# Patient Record
Sex: Male | Born: 1975
Health system: Southern US, Community
[De-identification: ages and names within clinical notes are randomized; demographics above are authoritative.]

## PROBLEM LIST (undated history)

## (undated) DIAGNOSIS — I10 Essential (primary) hypertension: Secondary | ICD-10-CM

## (undated) DIAGNOSIS — Z9989 Dependence on other enabling machines and devices: Secondary | ICD-10-CM

## (undated) DIAGNOSIS — G4733 Obstructive sleep apnea (adult) (pediatric): Secondary | ICD-10-CM

## (undated) HISTORY — DX: Essential (primary) hypertension: I10

## (undated) HISTORY — DX: Obstructive sleep apnea (adult) (pediatric): G47.33

## (undated) HISTORY — DX: Obstructive sleep apnea (adult) (pediatric): Z99.89

---

## 1998-05-30 ENCOUNTER — Emergency Department (HOSPITAL_COMMUNITY): Admission: EM | Admit: 1998-05-30 | Discharge: 1998-05-30 | Payer: Self-pay | Admitting: Emergency Medicine

## 2011-05-14 ENCOUNTER — Inpatient Hospital Stay (HOSPITAL_COMMUNITY)
Admission: AD | Admit: 2011-05-14 | Discharge: 2011-05-17 | DRG: 465 | Disposition: A | Discharge status: Home / Self Care | Payer: Managed Care, Other (non HMO) | Source: Ambulatory Visit | Attending: Orthopedic Surgery | Admitting: Orthopedic Surgery

## 2011-05-14 DIAGNOSIS — M869 Osteomyelitis, unspecified: Secondary | ICD-10-CM

## 2011-05-14 DIAGNOSIS — F17200 Nicotine dependence, unspecified, uncomplicated: Secondary | ICD-10-CM | POA: Diagnosis present

## 2011-05-14 DIAGNOSIS — L03039 Cellulitis of unspecified toe: Secondary | ICD-10-CM | POA: Diagnosis present

## 2011-05-14 DIAGNOSIS — L02619 Cutaneous abscess of unspecified foot: Secondary | ICD-10-CM | POA: Diagnosis present

## 2011-05-14 DIAGNOSIS — I10 Essential (primary) hypertension: Secondary | ICD-10-CM | POA: Diagnosis present

## 2011-05-14 LAB — HIV ANTIBODY (ROUTINE TESTING W REFLEX): HIV: NONREACTIVE

## 2011-05-14 LAB — MRSA PCR SCREENING: MRSA by PCR: NEGATIVE

## 2011-05-15 LAB — BASIC METABOLIC PANEL
BUN: 7 mg/dL (ref 6–23)
Creatinine, Ser: 0.71 mg/dL (ref 0.50–1.35)
GFR calc Af Amer: >60 mL/min (ref >60)
GFR calc non Af Amer: >60 mL/min (ref >60)

## 2011-05-16 NOTE — Op Note (Addendum)
NAMEDALEN, HENNESSEE NO.:  [id number]  MEDICAL RECORD NO.:  [id number]  LOCATION:  5025                         FACILITY:  [facility]  PHYSICIAN:  Feliberto Gottron. Turner Daniels, M.D.   DATE OF BIRTH:  1976-09-12  DATE OF PROCEDURE:  05/14/2011 DATE OF DISCHARGE:                              OPERATIVE REPORT   PREOPERATIVE DIAGNOSIS:  Necrotizing infection of the left fifth toe.  POSTOPERATIVE DIAGNOSIS:  Necrotizing infection of the left hip.  POSTOPERATIVE DIAGNOSIS:  Pain with an abscess.  PROCEDURE:  Irrigation and debridement left fifth toe.  SURGEON:  Feliberto Gottron. Turner Daniels, MD  FIRST ASSISTANT:  None.  ANESTHETIC:  General LMA.  ESTIMATED BLOOD LOSS:  10 mL.  BLOOD REPLACED:  500 mL of crystalloid.  DRAINS PLACED:  None.  TOURNIQUET TIME:  None.  INDICATIONS FOR PROCEDURE:  A 35 year old man who sustained an abrasion to his left fifth toe distal to the nail about 4 days ago.  He then began to develop swelling and pain in the toe, developed a blister yesterday, the blister ruptured, he was seen by the primary care physician, given a prescription for doxycycline and got Orthopedics evaluation this morning.  He had a necrotized area to the dorsal aspect of the left fifth toe about a centimeter in size that was full-thickness and down to the tendon.  There was obvious green purulent material in the wound and he had edema extending up the foot just below the ankle. He was afebrile, but having significant pain.  Because of the rather aggressive nature of the infection and necrosis of the dorsal end of the small toe, he was admitted for IV antibiotics for urgent irrigation and debridement.  Risks and benefits of surgery discussed, questions answered.  DESCRIPTION OF PROCEDURE:  The patient was identified by armband and taken to the operating room 4 at [facility].  Appropriate anesthetic monitors were attached and general LMA anesthesia was induced.  Tourniquet  was applied to the left ankle.  Left foot prepped and draped in usual sterile fashion from the toes to the tourniquet. Time-out procedure performed.  We began the operation by taking some soft tissue samples from the ulcer on the dorsum of the small toe and sending it off for Gram-stain and culture.  We then used a 15-blade and rongeurs to debride necrotic tissue from around the ulceration, which was about a centimeter in size down to the extensor tendon.  Once we cut back to a good bleeding tissue, we went ahead, thoroughly irrigated with normal saline solution alternating with hydrogen peroxide.  At this point, the ulceration was relatively clean.  We thoroughly probed proximally and distally looking for any pus pocket then removed and femoral lysis from the medial and lateral edges of patella as well.  We then placed a small Iodoform wick in the ulcer and a dressing of 4x4s between the toes, 4x4s on top of the ulcer with a 2-inch Kerlix and a 2- inch Ace wrap.  At this point, the patient was awakened, extubated, taken to the recovery room.     Feliberto Gottron. Turner Daniels, M.D.     Frithjof.Angelica  D:  05/14/2011  T:  05/15/2011  Job:  [id number]  Electronically Signed by Gean Birchwood M.D. on 06/16/2011 12:40:16 PM

## 2011-05-17 LAB — TISSUE CULTURE

## 2011-05-19 LAB — ANAEROBIC CULTURE

## 2011-06-13 NOTE — Discharge Summary (Signed)
  NAMEACEY, WOODFIELD NO.:  [id number]  MEDICAL RECORD NO.:  [id number]  LOCATION:  5025                         FACILITY:  [facility]  PHYSICIAN:  Feliberto Gottron. Turner Daniels, M.D.   DATE OF BIRTH:  08-15-1976  DATE OF ADMISSION:  05/14/2011 DATE OF DISCHARGE:  05/17/2011                              DISCHARGE SUMMARY   CHIEF COMPLAINT:  Left toe infection.  HISTORY OF PRESENT ILLNESS:  This is a 35 year old gentleman who stubbed his toe on May 04, 2011, at home.  He developed a sore that worsened over a period of several days.  [facility], [facility], where his toe was lanced.  He was given a prescription for antibiotics.  He presented to Dr. Wadie Lessen clinic on May 14, 2011, for orthopedic followup.  After evaluating the toe, it was determined that he would need incision and drainage, so he was admitted to [facility] for surgery.  PAST MEDICAL HISTORY:  Newly diagnosed hypertension. PAST SURGICAL HISTORY:  He has never had surgery.  ALLERGIES:  No known drug allergies.  SOCIAL HISTORY:  He smokes 1-pack of cigarettes per day and denies the use of alcohol.  FAMILY HISTORY:  Noncontributory.  OBJECTIVE:  Gross examination of the left foot demonstrates swelling and erythema at the fifth toe with purulent drainage.  IMAGING:  X-rays demonstrate no evidence of fracture.  PREOPERATIVE LABORATORY DATA:  Sodium 140, potassium 3.7, chloride 104, glucose 97, BUN 7, and creatinine 0.71.  HOSPITAL COURSE:  Mr. Fitchett was admitted from the clinic to [facility] on May 14, 2011, for incision and drainage of the fifth toe infection. Infectious Disease was consulted for managing his antibiotics.  The procedure was performed by Dr. Turner Daniels on May 14, 2011.  He did an incision and drainage of the left fifth toe for osteomyelitis.  A drain was placed into the foot and Mr. Norrington was transferred back to the Orthopedic Floor.  On postop day #1, he was awake  and alert and reporting good pain control.  The weight was removed from the left toe. His wound cultures were pending.  On postoperative day #2, he was doing well and able to void.  By the third postoperative day, his cultures had come back positive for methicillin-sensitive staph aureus.  He was discharged home on oral antibiotics.  DISPOSITION:  The patient was discharged home on May 17, 2011.  He would return to the clinic in 1 week for x-rays and suture removal.  DISCHARGE MEDICATIONS:  As per the HMR with the addition of Augmentin and Percocet for pain.  FINAL DIAGNOSIS:  Left fifth toe osteomyelitis.     Shirl Harris, PA   ______________________________ Feliberto Gottron. Turner Daniels, M.D.    JW/MEDQ  D:  06/08/2011  T:  06/08/2011  Job:  [id number]  Electronically Signed by Shirl Harris PA on 06/10/2011 08:23:00 AM Electronically Signed by Gean Birchwood M.D. on 06/13/2011 11:53:27 PM

## 2011-06-24 ENCOUNTER — Ambulatory Visit: Payer: Managed Care, Other (non HMO) | Admitting: Infectious Disease

## 2011-07-22 ENCOUNTER — Telehealth: Payer: Self-pay | Admitting: *Deleted

## 2011-07-22 ENCOUNTER — Ambulatory Visit: Payer: Managed Care, Other (non HMO) | Admitting: Infectious Disease

## 2011-07-22 NOTE — Telephone Encounter (Signed)
He did not come to his f/u appt. I called him & rescheduled it

## 2011-08-04 ENCOUNTER — Ambulatory Visit: Payer: Managed Care, Other (non HMO) | Admitting: Infectious Disease

## 2013-05-17 ENCOUNTER — Encounter (HOSPITAL_COMMUNITY): Payer: Self-pay | Admitting: Emergency Medicine

## 2013-05-17 ENCOUNTER — Emergency Department (HOSPITAL_COMMUNITY)
Admission: EM | Admit: 2013-05-17 | Discharge: 2013-05-17 | Disposition: A | Discharge status: Home / Self Care | Payer: BC Managed Care – PPO | Attending: Emergency Medicine | Admitting: Emergency Medicine

## 2013-05-17 DIAGNOSIS — M5431 Sciatica, right side: Secondary | ICD-10-CM

## 2013-05-17 DIAGNOSIS — F17200 Nicotine dependence, unspecified, uncomplicated: Secondary | ICD-10-CM | POA: Insufficient documentation

## 2013-05-17 DIAGNOSIS — Z79899 Other long term (current) drug therapy: Secondary | ICD-10-CM | POA: Insufficient documentation

## 2013-05-17 DIAGNOSIS — M79609 Pain in unspecified limb: Secondary | ICD-10-CM | POA: Insufficient documentation

## 2013-05-17 DIAGNOSIS — I10 Essential (primary) hypertension: Secondary | ICD-10-CM | POA: Insufficient documentation

## 2013-05-17 DIAGNOSIS — M5430 Sciatica, unspecified side: Secondary | ICD-10-CM | POA: Insufficient documentation

## 2013-05-17 MED ORDER — OXYCODONE-ACETAMINOPHEN 5-325 MG PO TABS: 1.0000 | ORAL_TABLET | ORAL | Status: DC | PRN

## 2013-05-17 MED ORDER — MELOXICAM 15 MG PO TABS: 15.0000 mg | ORAL_TABLET | Freq: Every day | ORAL | Status: DC

## 2013-05-17 MED ORDER — DEXAMETHASONE SODIUM PHOSPHATE 10 MG/ML IJ SOLN
10.0000 mg | Freq: Once | INTRAMUSCULAR | Status: AC
  Administered 2013-05-17: 10 mg via INTRAMUSCULAR
  Filled 2013-05-17: qty 1

## 2013-05-17 NOTE — ED Notes (Signed)
On Sunday began having rt leg pain that radiates to rt leg and then went to urgent care on mon and was given pain medication and a shot. States that he pain is still there and that it is not gettingany better. Denies any injury.

## 2013-05-17 NOTE — ED Provider Notes (Signed)
History    This chart was scribed for non-physician practitioner, Arthor Captain, PA-C,  working with Vida Roller, MD by Donne Anon, ED Scribe. This patient was seen in room  and the patient's care was started at 1520.   CSN: [id number]  Arrival date & time 05/17/13  1434   None     Chief Complaint  Patient presents with  . Back Pain  . Leg Pain    The history is provided by the patient. No language interpreter was used.   HPI Comments: Kevin Roy is a 37 y.o. male who presents to the Emergency Department complaining of 4 days of gradual onset, gradually worsening, constant, severe right sided back pain that radiates down the back side of his right leg described as "pounding." He denies any recent injury. He was seen in at [facility] 3 days ago and was given a shot of a muscle relaxer that has provided little relief. Walking and bending over makes the pain worse and he reports laying flat makes the pain slightly better. He denies incontinence, loss of sensation in the groin, knee pain, foot pain, fever, neck stiffness, chills, dysuria, increased frequency, hematauria or any other pain. He denies a history of IV drug use, herniated discs, kidney stones or recent procedures.  He has a hx of HTN and does not currently have a PCP. He is a current everyday smoker.     History reviewed. No pertinent past medical history.  History reviewed. No pertinent past surgical history.  No family history on file.  History  Substance Use Topics  . Smoking status: Not on file  . Smokeless tobacco: Not on file  . Alcohol Use: Not on file      Review of Systems  Constitutional: Negative for fever and chills.  HENT: Negative for neck pain.   Genitourinary: Negative for dysuria, frequency and hematuria.  Musculoskeletal: Positive for back pain.  Neurological: Negative for numbness.  All other systems reviewed and are negative.    Allergies  Review of patient's allergies  indicates no known allergies.  Home Medications   Current Outpatient Rx  Name  Route  Sig  Dispense  Refill  . amLODipine (NORVASC) 10 MG tablet   Oral   Take 10 mg by mouth daily.         . cyclobenzaprine (FLEXERIL) 10 MG tablet   Oral   Take 10 mg by mouth 3 (three) times daily as needed for muscle spasms.         Marland Kitchen HYDROcodone-acetaminophen (VICODIN) 5-500 MG per tablet   Oral   Take 1 tablet by mouth every 6 (six) hours as needed for pain.           Triage Vitals; BP 149/97  Pulse 86  Temp(Src) 98.1 F (36.7 C) (Oral)  Resp 18  Ht 6' 2.5" (1.892 m)  Wt 299 lb (135.626 kg)  BMI 37.89 kg/m2  SpO2 95%  Physical Exam  Nursing note and vitals reviewed. Constitutional: He appears well-developed and well-nourished. No distress.  HENT:  Head: Normocephalic and atraumatic.  Eyes: Conjunctivae are normal.  Neck: Normal range of motion.  Cardiovascular: Normal rate, regular rhythm and normal heart sounds.   No murmur heard. Pulmonary/Chest: Effort normal and breath sounds normal.  Abdominal: Soft.  Musculoskeletal: Normal range of motion.  Positive straight leg raise on right side. Normal reflexes. Strength limited due to pain.   Neurological: He is alert.  Skin: Skin is warm and dry.  Psychiatric: He has a normal mood and affect. His behavior is normal.    ED Course  Procedures (including critical care time) DIAGNOSTIC STUDIES: Oxygen Saturation is 95% on RA, adequate by my interpretation.    COORDINATION OF CARE: 3:18 PM Discussed treatment plan which includes a steroid shot, pain medication, muscle relaxers and antiinflammatory medication with pt at bedside and pt agreed to plan. Discussed sciatica pain. Advised pt to ice back. Will give a referral to orthopedics.    Labs Reviewed - No data to display No results found.   1. Sciatica neuralgia, right       MDM  3:51 PM Patient sxs consistent with sciatica. I am changing his medication to percocet  and asked him to d/c the vicodin due to the level of tylenol. Patient given IM decadron. Continue with flexeril as needed Mobic and ICE. Patient has made a follow up appointment with Dr. Turner Daniels for tomorrow at 4:45 PM. I personally performed the services described in this documentation, which was scribed in my presence. The recorded information has been reviewed and is accurate.        Arthor Captain, PA-C 05/18/13 [phone]

## 2013-05-24 NOTE — ED Provider Notes (Signed)
Medical screening examination/treatment/procedure(s) were performed by non-physician practitioner and as supervising physician I was immediately available for consultation/collaboration.    Johnell Bas D Tracker Mance, MD 05/24/13 1740 

## 2013-10-05 ENCOUNTER — Other Ambulatory Visit: Payer: Self-pay

## 2014-01-22 ENCOUNTER — Telehealth: Payer: Self-pay | Admitting: Physician Assistant

## 2014-01-22 MED ORDER — OSELTAMIVIR PHOSPHATE 75 MG PO CAPS: 75.0000 mg | ORAL_CAPSULE | Freq: Two times a day (BID) | ORAL | Status: DC

## 2014-01-22 NOTE — Telephone Encounter (Signed)
Pt's girlfriend in for treatment of probable influenza.  Girlfriend reports pt also became symptomatic today.  Will send tamiflu empirically

## 2016-12-31 ENCOUNTER — Ambulatory Visit (INDEPENDENT_AMBULATORY_CARE_PROVIDER_SITE_OTHER): Payer: BLUE CROSS/BLUE SHIELD

## 2016-12-31 ENCOUNTER — Encounter (HOSPITAL_COMMUNITY): Payer: Self-pay | Admitting: Emergency Medicine

## 2016-12-31 ENCOUNTER — Emergency Department (HOSPITAL_COMMUNITY): Payer: BLUE CROSS/BLUE SHIELD

## 2016-12-31 ENCOUNTER — Other Ambulatory Visit: Payer: Self-pay

## 2016-12-31 ENCOUNTER — Emergency Department (HOSPITAL_COMMUNITY)
Admission: EM | Admit: 2016-12-31 | Discharge: 2016-12-31 | Disposition: A | Discharge status: Home / Self Care | Payer: BLUE CROSS/BLUE SHIELD | Attending: Emergency Medicine | Admitting: Emergency Medicine

## 2016-12-31 ENCOUNTER — Encounter: Payer: Self-pay | Admitting: Physician Assistant

## 2016-12-31 ENCOUNTER — Ambulatory Visit (INDEPENDENT_AMBULATORY_CARE_PROVIDER_SITE_OTHER): Payer: BLUE CROSS/BLUE SHIELD | Admitting: Physician Assistant

## 2016-12-31 VITALS — BP 178/120 | HR 117 | Temp 100.9°F | Resp 20 | Ht 72.25 in | Wt 278.0 lb

## 2016-12-31 DIAGNOSIS — Z79899 Other long term (current) drug therapy: Secondary | ICD-10-CM | POA: Diagnosis not present

## 2016-12-31 DIAGNOSIS — R000 Tachycardia, unspecified: Secondary | ICD-10-CM | POA: Diagnosis not present

## 2016-12-31 DIAGNOSIS — F17210 Nicotine dependence, cigarettes, uncomplicated: Secondary | ICD-10-CM | POA: Diagnosis not present

## 2016-12-31 DIAGNOSIS — J111 Influenza due to unidentified influenza virus with other respiratory manifestations: Secondary | ICD-10-CM

## 2016-12-31 DIAGNOSIS — R69 Illness, unspecified: Secondary | ICD-10-CM | POA: Diagnosis not present

## 2016-12-31 DIAGNOSIS — R110 Nausea: Secondary | ICD-10-CM

## 2016-12-31 DIAGNOSIS — I10 Essential (primary) hypertension: Secondary | ICD-10-CM | POA: Diagnosis not present

## 2016-12-31 DIAGNOSIS — R509 Fever, unspecified: Secondary | ICD-10-CM | POA: Diagnosis present

## 2016-12-31 LAB — URINALYSIS, ROUTINE W REFLEX MICROSCOPIC
Bilirubin Urine: NEGATIVE
GLUCOSE, UA: NEGATIVE mg/dL
Hgb urine dipstick: NEGATIVE
KETONES UR: 5 mg/dL — AB
LEUKOCYTES UA: NEGATIVE
NITRITE: NEGATIVE
PH: 7 (ref 5.0–8.0)
Protein, ur: NEGATIVE mg/dL
SPECIFIC GRAVITY, URINE: 1.019 (ref 1.005–1.030)

## 2016-12-31 LAB — CBC WITH DIFFERENTIAL/PLATELET
Basophils Absolute: 0 10*3/uL (ref 0.0–0.1)
Basophils Relative: 0 %
Eosinophils Absolute: 0.1 10*3/uL (ref 0.0–0.7)
Eosinophils Relative: 1 %
HEMATOCRIT: 43.1 % (ref 39.0–52.0)
HEMOGLOBIN: 14.3 g/dL (ref 13.0–17.0)
LYMPHS ABS: 0.5 10*3/uL — AB (ref 0.7–4.0)
Lymphocytes Relative: 6 %
MCH: 28.6 pg (ref 26.0–34.0)
MCHC: 33.2 g/dL (ref 30.0–36.0)
MCV: 86.2 fL (ref 78.0–100.0)
MONO ABS: 0.7 10*3/uL (ref 0.1–1.0)
MONOS PCT: 9 %
NEUTROS ABS: 6.9 10*3/uL (ref 1.7–7.7)
Neutrophils Relative %: 84 %
Platelets: 216 10*3/uL (ref 150–400)
RBC: 5 MIL/uL (ref 4.22–5.81)
RDW: 14.3 % (ref 11.5–15.5)
WBC: 8.2 10*3/uL (ref 4.0–10.5)

## 2016-12-31 LAB — POCT CBC
GRANULOCYTE PERCENT: 88.8 % — AB (ref 37–80)
HCT, POC: 41.6 % — AB (ref 43.5–53.7)
Hemoglobin: 14.4 g/dL (ref 14.1–18.1)
LYMPH, POC: 0.7 (ref 0.6–3.4)
MCH, POC: 30 pg (ref 27–31.2)
MCHC: 34.6 g/dL (ref 31.8–35.4)
MCV: 86.7 fL (ref 80–97)
MID (cbc): 0.3 (ref 0–0.9)
MPV: 8.1 fL (ref 0–99.8)
PLATELET COUNT, POC: 202 10*3/uL (ref 142–424)
POC Granulocyte: 7.4 — AB (ref 2–6.9)
POC LYMPH %: 8.1 % — AB (ref 10–50)
POC MID %: 3.1 %M (ref 0–12)
RBC: 4.8 M/uL (ref 4.69–6.13)
RDW, POC: 13.7 %
WBC: 8.3 10*3/uL (ref 4.6–10.2)

## 2016-12-31 LAB — COMPREHENSIVE METABOLIC PANEL
ALT: 28 U/L (ref 17–63)
AST: 33 U/L (ref 15–41)
Albumin: 4 g/dL (ref 3.5–5.0)
Alkaline Phosphatase: 52 U/L (ref 38–126)
Anion gap: 9 (ref 5–15)
BILIRUBIN TOTAL: 0.6 mg/dL (ref 0.3–1.2)
BUN: 11 mg/dL (ref 6–20)
CHLORIDE: 105 mmol/L (ref 101–111)
CO2: 23 mmol/L (ref 22–32)
CREATININE: 0.94 mg/dL (ref 0.61–1.24)
Calcium: 8.5 mg/dL — ABNORMAL LOW (ref 8.9–10.3)
Glucose, Bld: 100 mg/dL — ABNORMAL HIGH (ref 65–99)
Potassium: 3.4 mmol/L — ABNORMAL LOW (ref 3.5–5.1)
Sodium: 137 mmol/L (ref 135–145)
TOTAL PROTEIN: 6.9 g/dL (ref 6.5–8.1)

## 2016-12-31 LAB — I-STAT TROPONIN, ED
TROPONIN I, POC: 0.02 ng/mL (ref 0.00–0.08)
Troponin i, poc: 0.03 ng/mL (ref 0.00–0.08)

## 2016-12-31 LAB — I-STAT CHEM 8, ED
BUN: 12 mg/dL (ref 6–20)
Calcium, Ion: 1.07 mmol/L — ABNORMAL LOW (ref 1.15–1.40)
Chloride: 104 mmol/L (ref 101–111)
Creatinine, Ser: 0.8 mg/dL (ref 0.61–1.24)
Glucose, Bld: 98 mg/dL (ref 65–99)
HEMATOCRIT: 44 % (ref 39.0–52.0)
Hemoglobin: 15 g/dL (ref 13.0–17.0)
POTASSIUM: 3.3 mmol/L — AB (ref 3.5–5.1)
SODIUM: 141 mmol/L (ref 135–145)
TCO2: 27 mmol/L (ref 0–100)

## 2016-12-31 MED ORDER — ACETAMINOPHEN 500 MG PO TABS
1000.0000 mg | ORAL_TABLET | Freq: Once | ORAL | Status: AC
  Administered 2016-12-31: 1000 mg via ORAL

## 2016-12-31 MED ORDER — OSELTAMIVIR PHOSPHATE 75 MG PO CAPS
75.0000 mg | ORAL_CAPSULE | Freq: Once | ORAL | Status: AC
  Administered 2016-12-31: 75 mg via ORAL
  Filled 2016-12-31: qty 1

## 2016-12-31 MED ORDER — ONDANSETRON HCL 4 MG/2ML IJ SOLN
4.0000 mg | Freq: Once | INTRAMUSCULAR | Status: AC
  Administered 2016-12-31: 4 mg via INTRAVENOUS
  Filled 2016-12-31: qty 2

## 2016-12-31 MED ORDER — LORAZEPAM 2 MG/ML IJ SOLN
1.0000 mg | Freq: Once | INTRAMUSCULAR | Status: AC
  Administered 2016-12-31: 1 mg via INTRAVENOUS
  Filled 2016-12-31: qty 1

## 2016-12-31 MED ORDER — IBUPROFEN 400 MG PO TABS
600.0000 mg | ORAL_TABLET | Freq: Once | ORAL | Status: AC
  Administered 2016-12-31: 600 mg via ORAL
  Filled 2016-12-31: qty 1

## 2016-12-31 MED ORDER — OSELTAMIVIR PHOSPHATE 75 MG PO CAPS: 75.0000 mg | ORAL_CAPSULE | Freq: Two times a day (BID) | ORAL | 0 refills | Status: DC

## 2016-12-31 MED ORDER — AMLODIPINE BESYLATE 5 MG PO TABS: 5.0000 mg | ORAL_TABLET | Freq: Every day | ORAL | 0 refills | Status: DC

## 2016-12-31 MED ORDER — ONDANSETRON 4 MG PO TBDP
4.0000 mg | ORAL_TABLET | Freq: Once | ORAL | Status: AC
Start: 2016-12-31 — End: 2016-12-31
  Administered 2016-12-31: 4 mg via ORAL

## 2016-12-31 NOTE — ED Triage Notes (Signed)
Pt here from Md office with fever of 101 , received tylenol at the office , pt HR 140 but down to 107 on arrival

## 2016-12-31 NOTE — ED Provider Notes (Signed)
[facility]-EMERGENCY DEPT Provider Note   CSN: [id number] Arrival date & time: 12/31/16  1904     History   Chief Complaint Chief Complaint  Patient presents with  . Abnormal ECG  . Fever    HPI Kevin Roy is a 41 y.o. male.  The history is provided by the patient. No language interpreter was used.  Fever      Kevin LampJulian Adney is a 41 y.o. male who presents to the Emergency Department complaining of fever, abnormal EKG.  He presents from urgent care for evaluation of flulike symptoms and abnormal EKG. He was in his routine state of health until this morning around 11:30 when he developed a feeling of sluggishness and generalized malaise. He then felt feverish and he presented to urgent care for evaluation. He had a temperature to 101 at the area was given IV fluids. He had an EKG performed that was abnormal and he was referred to the emergency department for further evaluation. He denies any chest pain, shortness of breath, nausea, vomiting, abdominal pain. He has a history of hypertension but is not currently on any medications. He has a family history of hypertension. He is a smoker. He has no personal or family history of cardiac disease. There are no prior EKGs available for comparison.  History reviewed. No pertinent past medical history.  There are no active problems to display for this patient.   History reviewed. No pertinent surgical history.     Home Medications    Prior to Admission medications   Medication Sig Start Date End Date Taking? Authorizing Provider  acetaminophen (TYLENOL) 500 MG tablet Take 1,000 mg by mouth every 6 (six) hours as needed.   Yes Historical Provider, MD  Apple Cid Vn-Grn Tea-Bit Or-Cr (APPLE CIDER VINEGAR PLUS) TABS Take 1 tablet by mouth daily as needed (COLD).   Yes Historical Provider, MD  B Complex-C (B-COMPLEX WITH VITAMIN C) tablet Take 1 tablet by mouth daily.   Yes Historical Provider, MD  ibuprofen (ADVIL,MOTRIN) 200 MG tablet Take 400  mg by mouth every 6 (six) hours as needed.   Yes Historical Provider, MD  amLODipine (NORVASC) 5 MG tablet Take 1 tablet (5 mg total) by mouth daily. 12/31/16   Tilden FossaElizabeth Glendene Wyer, MD  oseltamivir (TAMIFLU) 75 MG capsule Take 1 capsule (75 mg total) by mouth 2 (two) times daily. 12/31/16   Ofilia NeasMichael L Clark, PA-C    Family History History reviewed. No pertinent family history.  Social History Social History  Substance Use Topics  . Smoking status: Current Every Day Smoker    Packs/day: 1.00    Years: 25.00    Types: Cigarettes  . Smokeless tobacco: Never Used  . Alcohol use 0.6 oz/week    1 Cans of beer per week     Comment: OCCASSIONALY     Allergies   Patient has no known allergies.   Review of Systems Review of Systems  Constitutional: Positive for fever.  All other systems reviewed and are negative.    Physical Exam Updated Vital Signs BP 161/75   Pulse 107   Temp 100.4 F (38 C) (Oral)   Resp 20   Ht 6' 0.25" (1.835 m)   Wt 278 lb (126.1 kg)   SpO2 94%   BMI 37.44 kg/m   Physical Exam  Constitutional: He is oriented to person, place, and time. He appears well-developed and well-nourished.  HENT:  Head: Normocephalic and atraumatic.  Cardiovascular: Normal rate and regular rhythm.   No murmur  heard. Pulmonary/Chest: Effort normal and breath sounds normal. No respiratory distress.  Abdominal: Soft. There is no tenderness. There is no rebound and no guarding.  Musculoskeletal: He exhibits no edema or tenderness.  Neurological: He is alert and oriented to person, place, and time.  Skin: Skin is warm and dry.  Psychiatric: He has a normal mood and affect. His behavior is normal.  Nursing note and vitals reviewed.    ED Treatments / Results  Labs (all labs ordered are listed, but only abnormal results are displayed) Labs Reviewed  URINALYSIS, ROUTINE W REFLEX MICROSCOPIC - Abnormal; Notable for the following:       Result Value   Ketones, ur 5 (*)    All  other components within normal limits  CBC WITH DIFFERENTIAL/PLATELET - Abnormal; Notable for the following:    Lymphs Abs 0.5 (*)    All other components within normal limits  COMPREHENSIVE METABOLIC PANEL - Abnormal; Notable for the following:    Potassium 3.4 (*)    Glucose, Bld 100 (*)    Calcium 8.5 (*)    All other components within normal limits  I-STAT CHEM 8, ED - Abnormal; Notable for the following:    Potassium 3.3 (*)    Calcium, Ion 1.07 (*)    All other components within normal limits  I-STAT TROPOININ, ED  I-STAT TROPOININ, ED    EKG  EKG Interpretation None       Radiology Dg Chest 2 View  Result Date: 12/31/2016 CLINICAL DATA:  Flu like symptoms. EXAM: CHEST  2 VIEW COMPARISON:  None. FINDINGS: The heart size and mediastinal contours are within normal limits. Both lungs are clear. The visualized skeletal structures are unremarkable. IMPRESSION: No active cardiopulmonary disease. Electronically Signed   By: Ted Mcalpine M.D.   On: 12/31/2016 17:46    Procedures Procedures (including critical care time)  Medications Ordered in ED Medications  ondansetron (ZOFRAN) injection 4 mg (4 mg Intravenous Given 12/31/16 2135)  ibuprofen (ADVIL,MOTRIN) tablet 600 mg (600 mg Oral Given 12/31/16 2136)  oseltamivir (TAMIFLU) capsule 75 mg (75 mg Oral Given 12/31/16 2136)  LORazepam (ATIVAN) injection 1 mg (1 mg Intravenous Given 12/31/16 2135)     Initial Impression / Assessment and Plan / ED Course  I have reviewed the triage vital signs and the nursing notes.  Pertinent labs & imaging results that were available during my care of the patient were reviewed by me and considered in my medical decision making (see chart for details).     Patient with history of hypertension here from urgent care for influenza-like illness with EKG changes. EKG is abnormal and there are no priors available for comparison. Current clinical picture is not consistent with STEMI or  myocarditis. He has no active chest pain or shortness of breath. Discussed with Dr. Clifton James with cardiology patient's presentation and symptoms. He recommends serial troponins and discharge if no significant abnormality. Patient's exam findings are consistent with influenza-like illness. Following treatment in the emergency department with antipyretics, antiemetics and he is feeling partially improved. Plan to treat empirically for flu as well as initiate blood pressure medication. He has previously been on amlodipine and tolerated this well. Discussed with patient importance of PCP follow-up for further evaluation of his hypertension as well as close return precautions for his influenza-like illness.  Final Clinical Impressions(s) / ED Diagnoses   Final diagnoses:  Influenza-like illness  Essential hypertension    New Prescriptions Discharge Medication List as of 12/31/2016 11:35 PM  Tilden Fossa, MD 01/01/17 [phone]

## 2016-12-31 NOTE — Progress Notes (Signed)
12/31/2016 5:56 PM   DOB: 11/09/1976 / MRN: [id number]  SUBJECTIVE:  Kevin Roy is a 41 y.o. male presenting for cough, fever, myalgia, and HA that started suddenly today.  He has not had the seasonal flu shot.  Reports his girlfriend has the flu at home as well.  Tells me this is the sickest he has ever been. He has a history of HTN and has taken 10 of norvasc in the past.  He stopped taking this because "he could not tell a difference" but never measured his pressure.   He has No Known Allergies.   He  has no past medical history on file.    He  reports that he has been smoking Cigarettes.  He has a 25.00 pack-year smoking history. He has never used smokeless tobacco. He reports that he drinks about 0.6 oz of alcohol per week . He reports that he does not use drugs. He  has no sexual activity history on file. The patient  has no past surgical history on file.  His family history is not on file.  Review of Systems  Constitutional: Positive for chills, fever and malaise/fatigue.  HENT: Negative for sore throat.   Respiratory: Negative for cough and shortness of breath.   Cardiovascular: Negative for chest pain and leg swelling.  Gastrointestinal: Negative for nausea.  Musculoskeletal: Positive for myalgias.  Skin: Negative for itching and rash.  Neurological: Positive for dizziness (with standing) and headaches.    The problem list and medications were reviewed and updated by myself where necessary and exist elsewhere in the encounter.   OBJECTIVE:  BP (!) 178/120 (BP Location: Right Arm, Patient Position: Supine, Cuff Size: Large)   Pulse (!) 117   Temp (!) 100.9 F (38.3 C) (Oral)   Resp 20   Ht 6' 0.25" (1.835 m)   Wt 278 lb (126.1 kg)   SpO2 98%   BMI 37.44 kg/m   Physical Exam  Constitutional: He is oriented to person, place, and time. He has a sickly appearance. No distress.  Cardiovascular: Regular rhythm and normal heart sounds.  Tachycardia present.  Exam reveals  no gallop and no friction rub.   No murmur heard. Pulmonary/Chest: Effort normal and breath sounds normal. No respiratory distress. He has no wheezes. He has no rales. He exhibits no tenderness.  Musculoskeletal: Normal range of motion.  Neurological: He is alert and oriented to person, place, and time.  Skin: Skin is warm. He is diaphoretic.   Wt Readings from Last 3 Encounters:  12/31/16 278 lb (126.1 kg)  05/17/13 299 lb (135.6 kg)   Temp Readings from Last 3 Encounters:  12/31/16 (!) 100.9 F (38.3 C) (Oral)  05/17/13 98.1 F (36.7 C) (Oral)   BP Readings from Last 3 Encounters:  12/31/16 (!) 178/120  05/17/13 149/97   Pulse Readings from Last 3 Encounters:  12/31/16 (!) 117  05/17/13 86   Results for orders placed or performed in visit on 12/31/16 (from the past 72 hour(s))  POCT CBC     Status: Abnormal   Collection Time: 12/31/16  5:29 PM  Result Value Ref Range   WBC 8.3 4.6 - 10.2 K/uL   Lymph, poc 0.7 0.6 - 3.4   POC LYMPH PERCENT 8.1 (A) 10 - 50 %L   MID (cbc) 0.3 0 - 0.9   POC MID % 3.1 0 - 12 %M   POC Granulocyte 7.4 (A) 2 - 6.9   Granulocyte percent 88.8 (A) 37 - 80 %  G   RBC 4.80 4.69 - 6.13 M/uL   Hemoglobin 14.4 14.1 - 18.1 g/dL   HCT, POC 40.9 (A) 81.1 - 53.7 %   MCV 86.7 80 - 97 fL   MCH, POC 30.0 27 - 31.2 pg   MCHC 34.6 31.8 - 35.4 g/dL   RDW, POC 91.4 %   Platelet Count, POC 202 142 - 424 K/uL   MPV 8.1 0 - 99.8 fL    Dg Chest 2 View  Result Date: 12/31/2016 CLINICAL DATA:  Flu like symptoms. EXAM: CHEST  2 VIEW COMPARISON:  None. FINDINGS: The heart size and mediastinal contours are within normal limits. Both lungs are clear. The visualized skeletal structures are unremarkable. IMPRESSION: No active cardiopulmonary disease. Electronically Signed   By: Ted Mcalpine M.D.   On: 12/31/2016 17:46    ASSESSMENT AND PLAN:  Hosteen was seen today for fever and flu like symptoms.  Diagnoses and all orders for this visit:  Influenza-like  illness: Symptomology consistent with the flu.  WIll treat for that.  See problem 3.  -     oseltamivir (TAMIFLU) 75 MG capsule; Take 1 capsule (75 mg total) by mouth 2 (two) times daily. -     acetaminophen (TYLENOL) tablet 1,000 mg; Take 2 tablets (1,000 mg total) by mouth once. -     ondansetron (ZOFRAN-ODT) disintegrating tablet 4 mg; Take 1 tablet (4 mg total) by mouth once.  Nausea without vomiting  Hypertension, unspecified type: He has an abnormal EKG along with tachycardia. Several leads show ST segment depression.  This could possibly be strain.  He denies chest pain and SOB at this time however the EKG is worrisome.  I have discussed the EKG with both Dr. Clelia Croft and Dr. Neva Seat and they are uncomfortable as well.  Patient referred to the ED via 911.  He will go to [facility] ED for further eval and likely serial troponins.  -     EKG 12-Lead -     POCT CBC -     TSH -     Basic metabolic panel -     DG Chest 2 View; Future    The patient is advised to call or return to clinic if he does not see an improvement in symptoms, or to seek the care of the closest emergency department if he worsens with the above plan.   Deliah Boston, MHS, PA-C [facility] 12/31/2016 5:56 PM

## 2016-12-31 NOTE — Patient Instructions (Signed)
     IF you received an x-ray today, you will receive an invoice from [facility]. Please contact [facility] at [phone] with questions or concerns regarding your invoice.   IF you received labwork today, you will receive an invoice from [facility]. Please contact [facility] at [phone] with questions or concerns regarding your invoice.   Our billing staff will not be able to assist you with questions regarding bills from these companies.  You will be contacted with the lab results as soon as they are available. The fastest way to get your results is to activate your My Chart account. Instructions are located on the last page of this paperwork. If you have not heard from us regarding the results in 2 weeks, please contact this office.     

## 2017-01-01 LAB — BASIC METABOLIC PANEL
BUN/Creatinine Ratio: 13 (ref 9–20)
BUN: 11 mg/dL (ref 6–24)
CO2: 20 mmol/L (ref 18–29)
CREATININE: 0.87 mg/dL (ref 0.76–1.27)
Calcium: 8.6 mg/dL — ABNORMAL LOW (ref 8.7–10.2)
Chloride: 101 mmol/L (ref 96–106)
GFR calc Af Amer: 124 mL/min/{1.73_m2} (ref >59)
GFR calc non Af Amer: 107 mL/min/{1.73_m2} (ref >59)
GLUCOSE: 89 mg/dL (ref 65–99)
Potassium: 3.7 mmol/L (ref 3.5–5.2)
SODIUM: 140 mmol/L (ref 134–144)

## 2017-01-01 LAB — TSH: TSH: 0.263 u[IU]/mL — ABNORMAL LOW (ref 0.450–4.500)

## 2017-01-01 NOTE — Progress Notes (Signed)
Please add on free T4. Deliah BostonMichael Dawan Farney, MS, PA-C 7:35 PM, 01/01/2017

## 2017-01-02 NOTE — Addendum Note (Signed)
Addended by: Baldwin CrownJOHNSON, Nayeli Calvert D on: 01/02/2017 08:29 AM   Modules accepted: Orders

## 2017-01-04 ENCOUNTER — Telehealth: Payer: Self-pay

## 2017-01-04 NOTE — Telephone Encounter (Signed)
PATIENT WAS SEEN ON Thursday BY MICHAEL CLARK FOR THE FLU. HE WAS SENT TO THE HOSPITAL BY EMS. HE SAID HE WAS NOT ABLE TO GET A DOCTOR'S NOTE FOR HIS WORK. HE SAID HE STILL FEELS BAD. HE DOES NOT HAVE A FEVER BUT HE FEELS WEAK AND HIS APPETITE IS NOT VERY GOOD. HE WILL FINISH HIS TAMIFLU TOMORROW. HOW LONG DOES HE NEED TO BE OUT OF WORK AND CAN HE PICK A DOCTOR'S NOTE UP? BEST PHONE [phone] (HOME) PHARMACY CHOICE IS [facility] ON [address]  M[facility]

## 2017-01-04 NOTE — Telephone Encounter (Signed)
Do you want him to come back?  How long out for work?

## 2017-01-04 NOTE — Telephone Encounter (Signed)
Let's write him out for any time missed thus far and have him go back to work on Wednesday or Thursday, whichever he prefers. Deliah BostonMichael Leeasia Secrist, MS, PA-C 2:17 PM, 01/04/2017

## 2017-01-05 LAB — T4, FREE: Free T4: 0.98 ng/dL (ref 0.82–1.77)

## 2017-01-05 LAB — SPECIMEN STATUS REPORT

## 2017-01-15 ENCOUNTER — Ambulatory Visit (INDEPENDENT_AMBULATORY_CARE_PROVIDER_SITE_OTHER): Payer: BLUE CROSS/BLUE SHIELD | Admitting: Physician Assistant

## 2017-01-15 VITALS — BP 154/102 | HR 91 | Temp 98.7°F | Ht 72.25 in | Wt 279.6 lb

## 2017-01-15 DIAGNOSIS — R946 Abnormal results of thyroid function studies: Secondary | ICD-10-CM

## 2017-01-15 DIAGNOSIS — I10 Essential (primary) hypertension: Secondary | ICD-10-CM

## 2017-01-15 DIAGNOSIS — R7989 Other specified abnormal findings of blood chemistry: Secondary | ICD-10-CM

## 2017-01-15 MED ORDER — AMLODIPINE BESYLATE 5 MG PO TABS: 5.0000 mg | ORAL_TABLET | Freq: Every day | ORAL | 5 refills | Status: DC

## 2017-01-15 NOTE — Patient Instructions (Addendum)
Please message me if your pressure is greater than 140/90 on a consistent basis so I can increase you dose of Amlodipine.  If you need a prescription to help you quit smoking I am here to help you.  I will let you know what is going on with the thyroid once your labs result.     IF you received an x-ray today, you will receive an invoice from [facility]. Please contact [facility] at [phone] with questions or concerns regarding your invoice.   IF you received labwork today, you will receive an invoice from [facility]. Please contact [facility] at [phone] with questions or concerns regarding your invoice.   Our billing staff will not be able to assist you with questions regarding bills from these companies.  You will be contacted with the lab results as soon as they are available. The fastest way to get your results is to activate your My Chart account. Instructions are located on the last page of this paperwork. If you have not heard from us regarding the results in 2 weeks, please contact this office.

## 2017-01-15 NOTE — Progress Notes (Addendum)
01/15/2017 4:03 PM   DOB: Dec 09, 1975 / MRN: [id number]  SUBJECTIVE:  Kevin Roy is a 41 y.o. male presenting for HTN recehck.  Saw me for the flu about 2 weeks ago and was sent to the ED for an abnormal EKG.  HTN runs in his family. He feels well today and denies any complaints.   He is a smoker and has cut back to 1/2 pack and has a 13 pack year history.  He would like to quit and is continuing to cut back.    He has No Known Allergies.   He  has a past medical history of Hypertension.    He  reports that he has been smoking Cigarettes.  He has a 25.00 pack-year smoking history. He has never used smokeless tobacco. He reports that he drinks about 0.6 oz of alcohol per week . He reports that he does not use drugs. He  has no sexual activity history on file. The patient  has no past surgical history on file.  His family history includes Cancer in his father; Hyperlipidemia in his maternal grandmother and mother; Hypertension in his mother.  Review of Systems  Respiratory: Negative for cough and shortness of breath.   Cardiovascular: Negative for chest pain and leg swelling.  Musculoskeletal: Negative for myalgias.  Neurological: Negative for dizziness and headaches.    The problem list and medications were reviewed and updated by myself where necessary and exist elsewhere in the encounter.   OBJECTIVE:  BP (!) 154/102 (BP Location: Right Arm, Patient Position: Sitting, Cuff Size: Large)   Pulse 91   Temp 98.7 F (37.1 C) (Oral)   Ht 6' 0.25" (1.835 m)   Wt 279 lb 9.6 oz (126.8 kg)   SpO2 99%   BMI 37.66 kg/m   Physical Exam  Constitutional: He is oriented to person, place, and time.  Cardiovascular: Normal rate, regular rhythm and normal heart sounds.  Exam reveals no gallop, no friction rub and no decreased pulses.   No murmur heard. Pulmonary/Chest: Effort normal and breath sounds normal. No respiratory distress. He has no decreased breath sounds. He has no wheezes. He has  no rhonchi. He has no rales.  Musculoskeletal: He exhibits no edema.  Neurological: He is alert and oriented to person, place, and time.  Skin: Skin is warm and dry. He is not diaphoretic.     Lab Results  Component Value Date   TSH 0.263 (L) 12/31/2016    Lab Results  Component Value Date   CREATININE 0.80 12/31/2016   BUN 12 12/31/2016   NA 141 12/31/2016   K 3.3 (L) 12/31/2016   CL 104 12/31/2016   CO2 23 12/31/2016      No results found for this or any previous visit (from the past 72 hour(s)).  No results found.  ASSESSMENT AND PLAN:  Kevin Roy was seen today for medication refill.  Diagnoses and all orders for this visit:  Hypertension, unspecified type -     amLODipine (NORVASC) 5 MG tablet; Take 1 tablet (5 mg total) by mouth daily.  Decreased thyroid stimulating hormone (TSH) level -     TSH -     T4, Free -     Thyroid peroxidase antibody    The patient is advised to call or return to clinic if he does not see an improvement in symptoms, or to seek the care of the closest emergency department if he worsens with the above plan.   Deliah Boston, MHS, PA-C  [facility] 01/15/2017 4:03 PM    I have never seen this patient but will sign off  . Sent to me in error? Outpatient Encounter Prescriptions as of 01/15/2017  Medication Sig  . acetaminophen (TYLENOL) 500 MG tablet Take 1,000 mg by mouth every 6 (six) hours as needed.  . B Complex-C (B-COMPLEX WITH VITAMIN C) tablet Take 1 tablet by mouth daily.  Marland Kitchen. ibuprofen (ADVIL,MOTRIN) 200 MG tablet Take 400 mg by mouth every 6 (six) hours as needed.  . [DISCONTINUED] amLODipine (NORVASC) 5 MG tablet Take 1 tablet (5 mg total) by mouth daily.  Marland Kitchen. amLODipine (NORVASC) 5 MG tablet Take 1 tablet (5 mg total) by mouth daily.  Marland Kitchen. Apple Cid Vn-Grn Tea-Bit Or-Cr (APPLE CIDER VINEGAR PLUS) TABS Take 1 tablet by mouth daily as needed (COLD).  Marland Kitchen. oseltamivir (TAMIFLU) 75 MG capsule  Take 1 capsule (75 mg total) by mouth 2 (two) times daily. (Patient not taking: Reported on 01/15/2017)   No facility-administered encounter medications on file as of 01/15/2017.

## 2017-01-16 LAB — TSH: TSH: 0.633 u[IU]/mL (ref 0.450–4.500)

## 2017-01-16 LAB — T4, FREE: FREE T4: 1.15 ng/dL (ref 0.82–1.77)

## 2017-01-16 LAB — THYROID PEROXIDASE ANTIBODY: Thyroperoxidase Ab SerPl-aCnc: 13 IU/mL (ref 0–34)

## 2017-07-01 ENCOUNTER — Other Ambulatory Visit: Payer: Self-pay | Admitting: Physician Assistant

## 2017-07-01 DIAGNOSIS — I10 Essential (primary) hypertension: Secondary | ICD-10-CM

## 2017-07-01 NOTE — Telephone Encounter (Signed)
Mychart message sent to pt about making an apt for med refills °

## 2017-07-29 ENCOUNTER — Other Ambulatory Visit: Payer: Self-pay | Admitting: Physician Assistant

## 2017-07-29 DIAGNOSIS — I10 Essential (primary) hypertension: Secondary | ICD-10-CM

## 2017-08-06 ENCOUNTER — Encounter: Payer: Self-pay | Admitting: Physician Assistant

## 2017-08-06 ENCOUNTER — Ambulatory Visit (INDEPENDENT_AMBULATORY_CARE_PROVIDER_SITE_OTHER): Payer: BLUE CROSS/BLUE SHIELD | Admitting: Physician Assistant

## 2017-08-06 VITALS — BP 156/99 | HR 89 | Temp 98.8°F | Resp 16 | Ht 72.0 in | Wt 280.0 lb

## 2017-08-06 DIAGNOSIS — Z23 Encounter for immunization: Secondary | ICD-10-CM | POA: Diagnosis not present

## 2017-08-06 DIAGNOSIS — I10 Essential (primary) hypertension: Secondary | ICD-10-CM

## 2017-08-06 DIAGNOSIS — R0683 Snoring: Secondary | ICD-10-CM

## 2017-08-06 MED ORDER — AMLODIPINE BESYLATE 10 MG PO TABS: 10.0000 mg | ORAL_TABLET | Freq: Every day | ORAL | 3 refills | Status: DC

## 2017-08-06 NOTE — Progress Notes (Signed)
08/07/2017 10:46 AM   DOB: 1976-09-24 / MRN: [id number]  SUBJECTIVE:  Kevin Roy is a 41 y.o. male presenting for HTN refills. Does not check his pressure at home.  No leg swelling, chest pain, SOB or new DOE.  Continues smoking but is cutting back.  Can't quit yet.   Tells me that his girl friend told him that he stops breathing when he sleeps. Feels constantly fatigued and tells me that his fiancee tells him that he snores loudly and she worries that he may have sleep apnea.    He has No Known Allergies.   He  has a past medical history of Hypertension.    He  reports that he has been smoking Cigarettes.  He has a 25.00 pack-year smoking history. He has never used smokeless tobacco. He reports that he drinks about 0.6 oz of alcohol per week . He reports that he does not use drugs. He  has no sexual activity history on file. The patient  has no past surgical history on file.  His family history includes Cancer in his father; Hyperlipidemia in his maternal grandmother and mother; Hypertension in his mother.  Review of Systems  Constitutional: Negative for chills, diaphoresis and fever.  Eyes: Negative.   Respiratory: Negative for shortness of breath.   Cardiovascular: Negative for chest pain, orthopnea and leg swelling.  Gastrointestinal: Negative for abdominal pain and nausea.  Genitourinary: Negative for dysuria, flank pain, frequency, hematuria and urgency.  Skin: Negative for rash.  Neurological: Negative for dizziness, sensory change, speech change, focal weakness and headaches.    The problem list and medications were reviewed and updated by myself where necessary and exist elsewhere in the encounter.   OBJECTIVE:  BP (!) 156/99   Pulse 89   Temp 98.8 F (37.1 C) (Oral)   Resp 16   Ht 6' (1.829 m)   Wt 280 lb (127 kg)   SpO2 98%   BMI 37.97 kg/m   BP Readings from Last 3 Encounters:  08/06/17 (!) 156/99  01/15/17 (!) 154/102  12/31/16 161/75     Physical  Exam  Constitutional: He is oriented to person, place, and time. He appears well-developed. He is active and cooperative.  Non-toxic appearance.  HENT:  Right Ear: Hearing, tympanic membrane, external ear and ear canal normal.  Left Ear: Hearing, tympanic membrane, external ear and ear canal normal.  Nose: Nose normal. Right sinus exhibits no maxillary sinus tenderness and no frontal sinus tenderness. Left sinus exhibits no maxillary sinus tenderness and no frontal sinus tenderness.  Mouth/Throat: Uvula is midline, oropharynx is clear and moist and mucous membranes are normal. No oropharyngeal exudate, posterior oropharyngeal edema or tonsillar abscesses.  Eyes: Pupils are equal, round, and reactive to light. Conjunctivae and EOM are normal.  Cardiovascular: Normal rate, regular rhythm, S1 normal, S2 normal, normal heart sounds, intact distal pulses and normal pulses.  Exam reveals no gallop and no friction rub.   No murmur heard. Pulmonary/Chest: Effort normal. No tachypnea. He has no rales.  Abdominal: He exhibits no distension.  Musculoskeletal: He exhibits no edema.  Lymphadenopathy:       Head (right side): No submandibular and no tonsillar adenopathy present.       Head (left side): No submandibular and no tonsillar adenopathy present.    He has no cervical adenopathy.  Neurological: He is alert and oriented to person, place, and time. He has normal strength and normal reflexes. He is not disoriented. No cranial nerve  deficit or sensory deficit. He exhibits normal muscle tone. Coordination and gait normal.  Skin: Skin is warm and dry. He is not diaphoretic. No pallor.  Psychiatric: His behavior is normal.  Vitals reviewed.   Results for orders placed or performed in visit on 08/06/17 (from the past 72 hour(s))  TSH     Status: None   Collection Time: 08/06/17  4:32 PM  Result Value Ref Range   TSH 0.678 0.450 - 4.500 uIU/mL  CBC     Status: None   Collection Time: 08/06/17  4:32 PM   Result Value Ref Range   WBC 10.1 3.4 - 10.8 x10E3/uL   RBC 5.04 4.14 - 5.80 x10E6/uL   Hemoglobin 14.7 13.0 - 17.7 g/dL   Hematocrit 16.1 09.6 - 51.0 %   MCV 85 79 - 97 fL   MCH 29.2 26.6 - 33.0 pg   MCHC 34.3 31.5 - 35.7 g/dL   RDW 04.5 40.9 - 81.1 %   Platelets 277 150 - 379 x10E3/uL  CMP and Liver     Status: Abnormal   Collection Time: 08/06/17  4:32 PM  Result Value Ref Range   Glucose 146 (H) 65 - 99 mg/dL   BUN 15 6 - 24 mg/dL   Creatinine, Ser 9.14 0.76 - 1.27 mg/dL   GFR calc non Af Amer 107 >59 mL/min/1.73   GFR calc Af Amer 123 >59 mL/min/1.73   Sodium 140 134 - 144 mmol/L   Potassium 3.6 3.5 - 5.2 mmol/L   Chloride 102 96 - 106 mmol/L   CO2 22 20 - 29 mmol/L   Calcium 9.4 8.7 - 10.2 mg/dL   Total Protein 7.2 6.0 - 8.5 g/dL   Albumin 4.4 3.5 - 5.5 g/dL   Bilirubin Total 0.2 0.0 - 1.2 mg/dL   Bilirubin, Direct 7.82 0.00 - 0.40 mg/dL   Alkaline Phosphatase 72 39 - 117 IU/L   AST 21 0 - 40 IU/L   ALT 22 0 - 44 IU/L  Hemoglobin A1c     Status: Abnormal   Collection Time: 08/06/17  4:32 PM  Result Value Ref Range   Hgb A1c MFr Bld 6.8 (H) 4.8 - 5.6 %    Comment:          Prediabetes: 5.7 - 6.4          Diabetes: >6.4          Glycemic control for adults with diabetes: <7.0    Est. average glucose Bld gHb Est-mCnc 148 mg/dL    No results found.  ASSESSMENT AND PLAN:  Kevin Roy was seen today for medication refill and referral.  Diagnoses and all orders for this visit:  Essential hypertension: Increasing Norvasc to 10 daily.  Needs a sleep study per HPI.  Referring to neuro.  Will see him back in about 6 months.   -     amLODipine (NORVASC) 10 MG tablet; Take 1 tablet (10 mg total) by mouth daily. -     TSH -     CBC -     CMP and Liver -     Hemoglobin A1c  Needs flu shot -     Flu Vaccine QUAD 36+ mos IM  Snores -     Ambulatory referral to Neurology    The patient is advised to call or return to clinic if he does not see an improvement in  symptoms, or to seek the care of the closest emergency department if he worsens with the  above plan.   Deliah Boston, MHS, PA-C [facility] 08/07/2017 10:46 AM

## 2017-08-06 NOTE — Patient Instructions (Addendum)
Increase you Norvasc to 10 mg and I have prescribed you 1 year.  I would like to see you back in about 6 months.  I expect that you will see neurology in about 2 months.     IF you received an x-ray today, you will receive an invoice from [facility]. Please contact [facility] at [phone] with questions or concerns regarding your invoice.   IF you received labwork today, you will receive an invoice from [facility]. Please contact [facility] at [phone] with questions or concerns regarding your invoice.   Our billing staff will not be able to assist you with questions regarding bills from these companies.  You will be contacted with the lab results as soon as they are available. The fastest way to get your results is to activate your My Chart account. Instructions are located on the last page of this paperwork. If you have not heard from us regarding the results in 2 weeks, please contact this office.

## 2017-08-07 LAB — CBC
HEMATOCRIT: 42.8 % (ref 37.5–51.0)
HEMOGLOBIN: 14.7 g/dL (ref 13.0–17.7)
MCH: 29.2 pg (ref 26.6–33.0)
MCHC: 34.3 g/dL (ref 31.5–35.7)
MCV: 85 fL (ref 79–97)
Platelets: 277 10*3/uL (ref 150–379)
RBC: 5.04 x10E6/uL (ref 4.14–5.80)
RDW: 14.7 % (ref 12.3–15.4)
WBC: 10.1 10*3/uL (ref 3.4–10.8)

## 2017-08-07 LAB — CMP AND LIVER
ALK PHOS: 72 IU/L (ref 39–117)
ALT: 22 IU/L (ref 0–44)
AST: 21 IU/L (ref 0–40)
Albumin: 4.4 g/dL (ref 3.5–5.5)
BILIRUBIN TOTAL: 0.2 mg/dL (ref 0.0–1.2)
BILIRUBIN, DIRECT: 0.08 mg/dL (ref 0.00–0.40)
BUN: 15 mg/dL (ref 6–24)
CALCIUM: 9.4 mg/dL (ref 8.7–10.2)
CHLORIDE: 102 mmol/L (ref 96–106)
CO2: 22 mmol/L (ref 20–29)
Creatinine, Ser: 0.88 mg/dL (ref 0.76–1.27)
GFR calc non Af Amer: 107 mL/min/{1.73_m2} (ref >59)
GFR, EST AFRICAN AMERICAN: 123 mL/min/{1.73_m2} (ref >59)
Glucose: 146 mg/dL — ABNORMAL HIGH (ref 65–99)
POTASSIUM: 3.6 mmol/L (ref 3.5–5.2)
SODIUM: 140 mmol/L (ref 134–144)
TOTAL PROTEIN: 7.2 g/dL (ref 6.0–8.5)

## 2017-08-07 LAB — HEMOGLOBIN A1C
Est. average glucose Bld gHb Est-mCnc: 148 mg/dL
HEMOGLOBIN A1C: 6.8 % — AB (ref 4.8–5.6)

## 2017-08-07 LAB — TSH: TSH: 0.678 u[IU]/mL (ref 0.450–4.500)

## 2017-09-29 ENCOUNTER — Encounter: Payer: Self-pay | Admitting: Neurology

## 2017-09-29 ENCOUNTER — Ambulatory Visit (INDEPENDENT_AMBULATORY_CARE_PROVIDER_SITE_OTHER): Payer: BLUE CROSS/BLUE SHIELD | Admitting: Neurology

## 2017-09-29 VITALS — BP 153/108 | HR 84 | Ht 75.0 in | Wt 281.0 lb

## 2017-09-29 DIAGNOSIS — R0683 Snoring: Secondary | ICD-10-CM | POA: Diagnosis not present

## 2017-09-29 DIAGNOSIS — G4733 Obstructive sleep apnea (adult) (pediatric): Secondary | ICD-10-CM

## 2017-09-29 DIAGNOSIS — G4719 Other hypersomnia: Secondary | ICD-10-CM | POA: Insufficient documentation

## 2017-09-29 DIAGNOSIS — E65 Localized adiposity: Secondary | ICD-10-CM | POA: Diagnosis not present

## 2017-09-29 DIAGNOSIS — Z72820 Sleep deprivation: Secondary | ICD-10-CM | POA: Insufficient documentation

## 2017-09-29 NOTE — Progress Notes (Signed)
SLEEP MEDICINE CLINIC   Provider:  Melvyn Novas, M D  Primary Care Physician:  Silvestre Mesi   Referring Provider: Ofilia Neas, PA-C    Chief Complaint  Patient presents with  . New Patient (Initial Visit)    pt alone, rm . pt states his wife has told him he stops breathing and snores in sleep. pt states he has some days he feels like he didnt get enough sleep.     HPI:  Kevin Roy is a 41 y.o. male , seen here as in a referral from [facility] for evaluation of possible sleep apnea, patient is reportedly snoring loudly and breathing irregularly at night. Mr. Swing wife has actually recorded his snoring and his apneas and he was surprised to note it was.  He also has hypertension and has been treated with  medication, currently taking Norvasc. He was diagnosed as borderline diabetic and changed his diet.  He is unsure as to hypercholesterolemia being present.  He does feel fatigued and that his sleep is not as restorative and refreshing adequately.  He is currently still smoking, he does not have a history of coronary artery disease, cardiomyopathy, chest pain.  His blood pressures have run in the 150s and 160s systolic throughout his referring notes.   Sleep habits are as follows: The patient's bedtime is usually 10 PM, after watching TV in the evenings.  He describes his bedroom is cool, quiet and dark.  He prefers to sleep on his side, supported by 2 pillows.  He shares a bed with his wife and his dog.  The dog is snoring. He is always asleep very promptly but he does wake up to use the bathroom, usually at 2 AM, and not again until his wife leaves the house at about 4 AM. He usually wakes up when his wife goes to work, and does not go back to sleep.  The patient works between 6 AM and 4 PM and will take an afternoon nap after returning home.  He sleeps frequently on weekends, napping for 30-240 minutes.  He feels equally restored and refreshed by a nap or by his  nocturnal sleep.  He usually sleeps for about 5 hours.  Sleep medical history and family sleep history:  No family history of OSA.  Social history:  Newlywed , no children. Smokes  1 ppd, has been smoking since age 2, ETOH- occasionally , weekends 1-2 drinks. Caffeine - coffee in AM, 16 ounces.  He may drink a soda on occasion, he does not consume energy drinks.  He does drink sweet tea. 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  Review of Systems: Out of a complete 14 system review, the patient complains of only the following symptoms, and all other reviewed systems are negative. Aching muscles, the feeling of not getting enough sleep, decreased energy in the daytime, snoring at night, history of hypertension, borderline diabetes the patient is on only one prescription medication, Norvasc over-the-counter ibuprofen and acetaminophen. Epworth score  13 , Fatigue severity score 34  , depression score 2/15    Social History   Social History  . Marital status: Single    Spouse name: N/A  . Number of children: N/A  . Years of education: N/A   Occupational History  . Not on file.   Social History Main Topics  . Smoking status: Current Every Day Smoker    Packs/day:  1.00    Years: 25.00    Types: Cigarettes  . Smokeless tobacco: Never Used  . Alcohol use 0.6 oz/week    1 Cans of beer per week     Comment: OCCASSIONALY  . Drug use: No  . Sexual activity: Not on file   Other Topics Concern  . Not on file   Social History Narrative  . No narrative on file    Family History  Problem Relation Age of Onset  . Hypertension Mother   . Hyperlipidemia Mother   . Cancer Father   . Hyperlipidemia Maternal Grandmother     Past Medical History:  Diagnosis Date  . Hypertension     No past surgical history on file.  Current Outpatient Prescriptions  Medication Sig Dispense Refill  . acetaminophen (TYLENOL) 500 MG tablet Take  1,000 mg by mouth every 6 (six) hours as needed.    Marland Kitchen amLODipine (NORVASC) 10 MG tablet Take 1 tablet (10 mg total) by mouth daily. 90 tablet 3  . B Complex-C (B-COMPLEX WITH VITAMIN C) tablet Take 1 tablet by mouth daily.    Marland Kitchen ibuprofen (ADVIL,MOTRIN) 200 MG tablet Take 400 mg by mouth every 6 (six) hours as needed.     No current facility-administered medications for this visit.     Allergies as of 09/29/2017  . (No Known Allergies)    Vitals: BP (!) 153/108   Pulse 84   Ht 6\' 3"  (1.905 m)   Wt 281 lb (127.5 kg)   BMI 35.12 kg/m  Last Weight:  Wt Readings from Last 1 Encounters:  09/29/17 281 lb (127.5 kg)   ZOX:WRUE mass index is 35.12 kg/m.     Last Height:   Ht Readings from Last 1 Encounters:  09/29/17 6\' 3"  (1.905 m)    Physical exam:  General: The patient is awake, alert and appears not in acute distress. The patient is well groomed. Head: Normocephalic, atraumatic. Neck is supple. Mallampati 4,  neck circumference: 18.5 "  Nasal airflow patent, TMJ click is not evident . Retrognathia is seen.  Cardiovascular:  Regular rate and rhythm , without  murmurs or carotid bruit, and without distended neck veins. Respiratory: Lungs are clear to auscultation. Skin:  Without evidence of edema, or rash- has many tattooes Trunk: BMI is 35. The patient's posture is erect   Neurologic exam : The patient is awake and alert, oriented to place and time.   Attention span & concentration ability appears normal.  Speech is fluent, without dysarthria, dysphonia or aphasia.  Mood and affect are appropriate.  Cranial nerves: Pupils are equal and briskly reactive to light. Funduscopic exam without evidence of pallor or edema.  Extraocular movements  in vertical and horizontal planes intact and without nystagmus. Visual fields by finger perimetry are intact. Hearing to finger rub intact.  Facial sensation intact to fine touch. Facial motor strength is symmetric and tongue and uvula move  midline. Shoulder shrug was symmetrical.  Motor exam:  Normal tone, muscle bulk and symmetric strength in all extremities. Sensory:  Fine touch, pinprick and vibration were tested in all extremities. Proprioception tested in the upper extremities was normal. Coordination: Rapid alternating movements in the fingers/hands was normal. Finger-to-nose maneuver  normal without evidence of ataxia, dysmetria or tremor. Gait and station: Patient walks without assistive device -Turns with  3 Steps. Romberg testing is  negative. Deep tendon reflexes: in the  upper and lower extremities are symmetric and intact.   Assessment:  After physical  and neurologic examination, review of laboratory studies,  Personal review of imaging studies, reports of other /same  Imaging studies, results of polysomnography and / or neurophysiology testing and pre-existing records as far as provided in visit., my assessment is   1) I have no doubt that Mr. Lissa HoardBoone will test positive for sleep apnea given that he had an audio tape made the children's to snore and had documented pauses in his breathing.  He has several risk factors which include an elevated body mass index a large neck circumference, micrognathia and African-American race. 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  He appears muscular.  He does not get enough sleep reviewing his sleep habits.  I would like for him to try to get to bed at least half an hour earlier, and he stopped to try some over-the-counter melatonin to help him sleep longer through the night.  I would hope that this also allows him to rest more soundly and regain some daytime alertness.  Independent of what I considered sleep deprivation we need to identify the degree of apnea and treat accordingly. HST ordered.    The patient was advised of the nature of the diagnosed disorder , the treatment options and the  risks for general health and wellness arising from not treating the condition.    I spent more than 40 minutes of face to face time with the patient.  Greater than 50% of time was spent in counseling and coordination of care. We have discussed the diagnosis and differential and I answered the patient's questions.    Plan:  Treatment plan and additional workup :  HST - apnea screening.    Melvyn NovasARMEN Grant Swager, MD 09/29/2017, 3:27 PM  Certified in Neurology by ABPN Certified in Sleep Medicine by [facility]  [facility] [address], [address] [place], [place] [address]

## 2017-10-12 DIAGNOSIS — M5441 Lumbago with sciatica, right side: Secondary | ICD-10-CM | POA: Diagnosis not present

## 2017-10-18 ENCOUNTER — Ambulatory Visit (INDEPENDENT_AMBULATORY_CARE_PROVIDER_SITE_OTHER): Payer: BLUE CROSS/BLUE SHIELD | Admitting: Neurology

## 2017-10-18 DIAGNOSIS — R0683 Snoring: Secondary | ICD-10-CM

## 2017-10-18 DIAGNOSIS — G4719 Other hypersomnia: Secondary | ICD-10-CM

## 2017-10-18 DIAGNOSIS — G4733 Obstructive sleep apnea (adult) (pediatric): Secondary | ICD-10-CM | POA: Diagnosis not present

## 2017-10-18 DIAGNOSIS — Z72820 Sleep deprivation: Secondary | ICD-10-CM

## 2017-10-25 ENCOUNTER — Other Ambulatory Visit: Payer: Self-pay | Admitting: Neurology

## 2017-10-25 ENCOUNTER — Telehealth: Payer: Self-pay | Admitting: Neurology

## 2017-10-25 DIAGNOSIS — R0683 Snoring: Secondary | ICD-10-CM

## 2017-10-25 DIAGNOSIS — G4719 Other hypersomnia: Secondary | ICD-10-CM

## 2017-10-25 DIAGNOSIS — Z72820 Sleep deprivation: Secondary | ICD-10-CM

## 2017-10-25 DIAGNOSIS — E669 Obesity, unspecified: Secondary | ICD-10-CM

## 2017-10-25 DIAGNOSIS — G4733 Obstructive sleep apnea (adult) (pediatric): Secondary | ICD-10-CM

## 2017-10-25 NOTE — Telephone Encounter (Signed)
I called pt. I advised pt that Dr. Vickey Hugerohmeier reviewed their sleep study results and found that pt has moderate to severe sleep apnea recommends that pt starts auto CPAP. I reviewed PAP compliance expectations with the pt. Pt is agreeable to starting an auto-PAP. I advised [facility] will call the pt within about one week after they file with the pt's insurance. [facility] will show the pt how to use the machine, fit for masks, and troubleshoot the auto-PAP if needed. A follow up appt was made for insurance purposes with Darrol Angelarolyn Martin, NP on Feb. Pt verbalized understanding to arrive 15 minutes early and bring their auto-PAP. A letter with all of this information in it will be mailed to the pt as a reminder. I verified with the pt that the address we have on file is correct. Pt verbalized understanding of results. Pt had no questions at this time but was encouraged to call back if questions arise.

## 2017-10-25 NOTE — Telephone Encounter (Signed)
-----   Message from Melvyn Novasarmen Dohmeier, MD sent at 10/25/2017 10:39 AM EST ----- Moderately severe OSA and loud snoring, but not many signs of physiological stress ( heart rate , Oxygenation )   CPAP auto order 5-15 cm water with 3 cm EPR and mask to be fitted to patients comfort,  added heated humidity.

## 2017-10-25 NOTE — Procedures (Signed)
[facility] [address] [place], [place] [address] NAME:  Kevin RatelJulian P. Plunk                                                    DOB: 11-Feb-1976 MEDICAL RECORD NUMBER  [id number]                                 DOS: 10/18/2017 REFERRING PHYSICIAN: Deliah BostonMichael Clark, PA-C  STUDY PERFORMED: Home Sleep Study, Watch PAT  HISTORY: Elicia LampJulian Mini is a 41 y.o. male patient who is reportedly snoring loudly and breathing irregularly at night. Mr. Verne SpurrBoone's wife has actually recorded his snoring and his apneas . He has HTN, currently taking Norvasc. He was diagnosed as borderline diabetic and changed his diet.  He is unsure as to hypercholesterolemia being present.  He does feel fatigued and that his sleep is not as restorative and refreshing.  He is smoking, he does not have a history of coronary artery disease, cardiomyopathy, and chest pain.  His blood pressures have run in the 150s and 160s systolic throughout his referring visit notes- Epworth Sleepiness score endorsed at 13/ 24 points, Fatigue severity score 34 points, BMI 35.51           STUDY RESULTS: Total Recording Time:  7 hours 23 minutes, Sleep time was 5 hrs. 13 min.  Total Apnea/Hypopnea Index (AHI):  26.1 /hour, RDI was 28.8/hr. Average Oxygen Saturation: 95 % SpO2; Lowest Oxygen Saturation: 87 %  Total Time Oxygen Saturation Below or at 88%:   0.3 minutes, and 0.1% Average Heart Rate:   77 bpm (47-125 bpm) borderline brady-tachy cardia IMPRESSION:  Moderate severe Sleep apnea with loud snoring over 45 dB. AHI and RDI were highest in prone sleep position (unusual) and lowest while sleeping on the right side. NO prolonged hypoxia and only borderline tachycardia.  RECOMMENDATION: The patient should try CPAP intervention first, and aim to lose weight.  Sleeping on his right side will reduce the apnea further. If CPAP is not working for him, I would consider a dental device or ENT procedure as alternatives.  CPAP auto  order 5-15 cm water with 3 cm EPR and mask to be fitted to patients comfort. O added heated humidity.  I certify that I have reviewed the raw data recording prior to the issuance of this report in accordance with the standards of the American Academy of Sleep Medicine (AASM). Melvyn Novasarmen Raeford Brandenburg, M.D.  10-25-2017  Medical Director of [facility], Diplomat, of the ABPN and ABSM and accredited by the AASM

## 2017-10-28 DIAGNOSIS — G4733 Obstructive sleep apnea (adult) (pediatric): Secondary | ICD-10-CM | POA: Diagnosis not present

## 2017-11-27 DIAGNOSIS — G4733 Obstructive sleep apnea (adult) (pediatric): Secondary | ICD-10-CM | POA: Diagnosis not present

## 2017-12-28 DIAGNOSIS — G4733 Obstructive sleep apnea (adult) (pediatric): Secondary | ICD-10-CM | POA: Diagnosis not present

## 2018-01-17 ENCOUNTER — Ambulatory Visit: Payer: Self-pay | Admitting: Nurse Practitioner

## 2018-01-27 DIAGNOSIS — G4733 Obstructive sleep apnea (adult) (pediatric): Secondary | ICD-10-CM | POA: Diagnosis not present

## 2018-02-14 NOTE — Progress Notes (Signed)
[facility]  PATIENT: Kevin Roy DOB: 04/30/1976   REASON FOR VISIT: Follow-up for newly diagnosed obstructive sleep apnea with initial CPAP compliance  HISTORY FROM: Patient    HISTORY OF PRESENT ILLNESS:UPDATE 3/20/2019CM Kevin Roy, 42 year old male returns for follow-up with newly diagnosed obstructive sleep apnea here for  initial CPAP compliance.  He has had no problems getting used to his machine.  He recently had his mask changed for a better fit.  Compliance data dated 01/17/2018-02/15/2018 shows compliance greater than 4 hours at 30 days at 100%.  Average usage 6 hours 54 minutes.  Set pressure 5-15 cm.  EPR 3 AHI 1.6.  He returns for reevaluation  10/31/18CDJulian Roy is a 42 y.o. male , seen here as in a referral from [facility] for evaluation of possible sleep apnea, patient is reportedly snoring loudly and breathing irregularly at night. Kevin Roy wife has actually recorded his snoring and his apneas and he was surprised to note it was.  He also has hypertension and has been treated with  medication, currently taking Norvasc. He was diagnosed as borderline diabetic and changed his diet.  He is unsure as to hypercholesterolemia being present.  He does feel fatigued and that his sleep is not as restorative and refreshing adequately.  He is currently still smoking, he does not have a history of coronary artery disease, cardiomyopathy, chest pain.  His blood pressures have run in the 150s and 160s systolic throughout his referring notes.   Sleep habits are as follows: The patient's bedtime is usually 10 PM, after watching TV in the evenings.  He describes his bedroom is cool, quiet and dark.  He prefers to sleep on his side, supported by 2 pillows.  He shares a bed with his wife and his dog.  The dog is snoring. He is always asleep very promptly but he does wake up to use the bathroom, usually at 2 AM, and not again until his wife leaves the house at about 4  AM. He usually wakes up when his wife goes to work, and does not go back to sleep.  The patient works between 6 AM and 4 PM and will take an afternoon nap after returning home.  He sleeps frequently on weekends, napping for 30-240 minutes.  He feels equally restored and refreshed by a nap or by his nocturnal sleep.  He usually sleeps for about 5 hours REVIEW OF SYSTEMS: Full 14 system review of systems performed and notable only for those listed, all others are neg:  Constitutional: neg  Cardiovascular: neg Ear/Nose/Throat: neg  Skin: neg Eyes: neg Respiratory: neg Gastroitestinal: neg  Hematology/Lymphatic: neg  Endocrine: neg Musculoskeletal:neg Allergy/Immunology: neg Neurological: neg Psychiatric: neg Sleep : Obstructive sleep apnea with CPAP   ALLERGIES: No Known Allergies  HOME MEDICATIONS: Outpatient Medications Prior to Visit  Medication Sig Dispense Refill  . acetaminophen (TYLENOL) 500 MG tablet Take 1,000 mg by mouth every 6 (six) hours as needed.    Marland Kitchen amLODipine (NORVASC) 10 MG tablet Take 1 tablet (10 mg total) by mouth daily. 90 tablet 3  . B Complex-C (B-COMPLEX WITH VITAMIN C) tablet Take 1 tablet by mouth daily.    Marland Kitchen ibuprofen (ADVIL,MOTRIN) 200 MG tablet Take 400 mg by mouth every 6 (six) hours as needed.     No facility-administered medications prior to visit.     PAST MEDICAL HISTORY: Past Medical History:  Diagnosis Date  . Hypertension   . OSA on CPAP  PAST SURGICAL HISTORY: History reviewed. No pertinent surgical history.  FAMILY HISTORY: Family History  Problem Relation Age of Onset  . Hypertension Mother   . Hyperlipidemia Mother   . Cancer Father   . Hyperlipidemia Maternal Grandmother     SOCIAL HISTORY: Social History   Socioeconomic History  . Marital status: Single    Spouse name: Not on file  . Number of children: Not on file  . Years of education: Not on file  . Highest education level: Not on file  Social Needs  .  Financial resource strain: Not on file  . Food insecurity - worry: Not on file  . Food insecurity - inability: Not on file  . Transportation needs - medical: Not on file  . Transportation needs - non-medical: Not on file  Occupational History  . Not on file  Tobacco Use  . Smoking status: Current Every Day Smoker    Packs/day: 1.00    Years: 25.00    Pack years: 25.00    Types: Cigarettes  . Smokeless tobacco: Never Used  . Tobacco comment: 02/16/18 1/2 PPD  Substance and Sexual Activity  . Alcohol use: Yes    Alcohol/week: 0.6 oz    Types: 1 Cans of beer per week    Comment: OCCASSIONALY  . Drug use: No  . Sexual activity: Not on file  Other Topics Concern  . Not on file  Social History Narrative  . Not on file     PHYSICAL EXAM  Vitals:   02/16/18 1340  BP: (!) 145/95  Pulse: 86  Weight: 279 lb 12.8 oz (126.9 kg)   Body mass index is 34.97 kg/m.  Generalized: Well developed, obese male in no acute distress  Head: normocephalic and atraumatic,. Oropharynx benign mallopatti 4 Neck: Supple, circumference 19 Musculoskeletal: No deformity   Neurological examination   Mentation: Alert oriented to time, place, history taking. Attention span and concentration appropriate. Recent and remote memory intact.  Follows all commands speech and language fluent.   Cranial nerve II-XII: Pupils were equal round reactive to light extraocular movements were full, visual field were full on confrontational test. Facial sensation and strength were normal. hearing was intact to finger rubbing bilaterally. Uvula tongue midline. head turning and shoulder shrug were normal and symmetric.Tongue protrusion into cheek strength was normal. Motor: normal bulk and tone, full strength in the BUE, BLE, Sensory: normal and symmetric to light touch,  Coordination: finger-nose-finger, heel-to-shin bilaterally, no dysmetria Gait and Station: Rising up from seated position without assistance, normal  stance,  moderate stride, good arm swing, smooth turning, able to perform tiptoe, and heel walking without difficulty. Tandem gait is steady  DIAGNOSTIC DATA (LABS, IMAGING, TESTING) - I reviewed patient records, labs, notes, testing and imaging myself where available.  Lab Results  Component Value Date   WBC 10.1 08/06/2017   HGB 14.7 08/06/2017   HCT 42.8 08/06/2017   MCV 85 08/06/2017   PLT 277 08/06/2017      Component Value Date/Time   NA 140 08/06/2017 1632   K 3.6 08/06/2017 1632   CL 102 08/06/2017 1632   CO2 22 08/06/2017 1632   GLUCOSE 146 (H) 08/06/2017 1632   GLUCOSE 98 12/31/2016 1957   BUN 15 08/06/2017 1632   CREATININE 0.88 08/06/2017 1632   CALCIUM 9.4 08/06/2017 1632   PROT 7.2 08/06/2017 1632   ALBUMIN 4.4 08/06/2017 1632   AST 21 08/06/2017 1632   ALT 22 08/06/2017 1632   ALKPHOS 72 08/06/2017  1632   BILITOT 0.2 08/06/2017 1632   GFRNONAA 107 08/06/2017 1632   GFRAA 123 08/06/2017 1632    Lab Results  Component Value Date   HGBA1C 6.8 (H) 08/06/2017   No results found for: VITAMINB12 Lab Results  Component Value Date   TSH 0.678 08/06/2017      ASSESSMENT AND PLAN  42 y.o. year old male  has a past medical history of Hypertension and OSA on CPAP.  Here to follow-up for his initial CPAP compliance.Data dated 01/17/2018-02/15/2018 shows compliance greater than 4 hours at 30 days at 100%.  Average usage 6 hours 54 minutes.  Set pressure 5-15 cm.  EPR 3 AHI 1.6.    PLAN: CPAP compliance 100%, reviewed data with patient Continue same settings Follow-up in 6 months Nilda RiggsNancy Carolyn Deral Schellenberg, [facility]NP, [facility], APRN  [facility] [address] [place], [place] [address] [phone]

## 2018-02-15 ENCOUNTER — Encounter: Payer: Self-pay | Admitting: Nurse Practitioner

## 2018-02-16 ENCOUNTER — Encounter: Payer: Self-pay | Admitting: Physician Assistant

## 2018-02-16 ENCOUNTER — Other Ambulatory Visit: Payer: Self-pay

## 2018-02-16 ENCOUNTER — Ambulatory Visit: Payer: BLUE CROSS/BLUE SHIELD | Admitting: Physician Assistant

## 2018-02-16 ENCOUNTER — Ambulatory Visit: Payer: BLUE CROSS/BLUE SHIELD | Admitting: Nurse Practitioner

## 2018-02-16 ENCOUNTER — Encounter: Payer: Self-pay | Admitting: Nurse Practitioner

## 2018-02-16 VITALS — BP 138/82 | HR 82 | Temp 98.6°F | Resp 18 | Ht 75.0 in | Wt 276.4 lb

## 2018-02-16 DIAGNOSIS — I10 Essential (primary) hypertension: Secondary | ICD-10-CM | POA: Diagnosis not present

## 2018-02-16 DIAGNOSIS — Z9189 Other specified personal risk factors, not elsewhere classified: Secondary | ICD-10-CM

## 2018-02-16 DIAGNOSIS — E119 Type 2 diabetes mellitus without complications: Secondary | ICD-10-CM | POA: Diagnosis not present

## 2018-02-16 DIAGNOSIS — Z9989 Dependence on other enabling machines and devices: Secondary | ICD-10-CM | POA: Diagnosis not present

## 2018-02-16 DIAGNOSIS — G4733 Obstructive sleep apnea (adult) (pediatric): Secondary | ICD-10-CM | POA: Insufficient documentation

## 2018-02-16 DIAGNOSIS — F17200 Nicotine dependence, unspecified, uncomplicated: Secondary | ICD-10-CM

## 2018-02-16 LAB — POCT GLYCOSYLATED HEMOGLOBIN (HGB A1C): HEMOGLOBIN A1C: 10.4

## 2018-02-16 MED ORDER — ROSUVASTATIN CALCIUM 20 MG PO TABS: 20.0000 mg | ORAL_TABLET | Freq: Every day | ORAL | 3 refills | Status: DC

## 2018-02-16 MED ORDER — ASPIRIN 81 MG PO TBEC
81.0000 mg | DELAYED_RELEASE_TABLET | Freq: Every day | ORAL | 3 refills | Status: AC
Start: 2018-02-16 — End: ?

## 2018-02-16 MED ORDER — METFORMIN HCL 500 MG PO TABS: ORAL_TABLET | ORAL | 3 refills | Status: DC

## 2018-02-16 NOTE — Patient Instructions (Signed)
CPAP compliance 100% Continue same settings follow-up in 6 months Follow-up in 6 months

## 2018-02-16 NOTE — Progress Notes (Signed)
I agree with the assessment and plan as directed by NP .The patient is known to me .   Carel Carrier, MD  

## 2018-02-16 NOTE — Patient Instructions (Addendum)
Exercise improves every system in the body.  It lowers the risk of heart disease, decreases blood pressure, reduces the symptoms of depression and anxiety, and lowers blood sugar. To receive these benefits, try to get 150 minutes of planned exercise each week.  You can break this 150 minutes up however you like.  For instance, you can perform 30 minutes of brisk walking 5 days a week, or perform 50 minutes 3 days a week.  If you don't like walking, or can't find a safe place to walk, find another way to move that you can enjoy.  Exercise tapes, cycling, stair climbing, swimming, or a combination will be just as good as a walking program. To ensure the proper intensity, you can use the talk test. Essentially, you should be able to carry on a conversation, but you should have to take short breaks from the conversation in order catch your breath.     IF you received an x-ray today, you will receive an invoice from [facility]. Please contact [facility] at [phone] with questions or concerns regarding your invoice.   IF you received labwork today, you will receive an invoice from [facility]. Please contact [facility] at [phone] with questions or concerns regarding your invoice.   Our billing staff will not be able to assist you with questions regarding bills from these companies.  You will be contacted with the lab results as soon as they are available. The fastest way to get your results is to activate your My Chart account. Instructions are located on the last page of this paperwork. If you have not heard from us regarding the results in 2 weeks, please contact this office.     

## 2018-02-16 NOTE — Progress Notes (Signed)
02/17/2018 1:38 PM   DOB: 20-Dec-1975 / MRN: [id number]  SUBJECTIVE:  Kevin LampJulian Roy is a 42 y.o. male presenting for recheck of hypertension, smoking, most recent elevated A1c.  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  Reports that he has much less stress.  He has not changed his diet in any significant way nor has he started an exercise routine.  Last A1c 7 months ago showed early diabetes type 2.  Today's measure shows progression of the disease.  Patient is willing to start several therapies today and does want to go to diabetes management to educate himself on the management of elevated blood sugar.  Patient continues to smoke tells me he is cutting back he used to smoke up to 2 packs a day and now is down to 3/4 pack daily. He is not ready to quit yet.  He has No Known Allergies.   He  has a past medical history of Hypertension and OSA on CPAP.    He  reports that he has been smoking cigarettes.  He has a 25.00 pack-year smoking history. He has never used smokeless tobacco. He reports that he drinks about 0.6 oz of alcohol per week. He reports that he does not use drugs. He  has no sexual activity history on file. The patient  has no past surgical history on file.  His family history includes Cancer in his father; Hyperlipidemia in his maternal grandmother and mother; Hypertension in his mother.  Review of Systems  Constitutional: Negative for chills, diaphoresis and fever.  Respiratory: Negative for cough, hemoptysis, sputum production, shortness of breath and wheezing.   Cardiovascular: Negative for chest pain, orthopnea and leg swelling.  Gastrointestinal: Negative for abdominal pain, blood in stool, constipation, diarrhea, heartburn, melena, nausea and vomiting.  Genitourinary: Negative for dysuria, flank pain, frequency, hematuria and urgency.  Skin: Negative for rash.  Neurological: Negative for  dizziness.    The problem list and medications were reviewed and updated by myself where necessary and exist elsewhere in the encounter.   OBJECTIVE:  BP 138/82 (BP Location: Left Arm, Patient Position: Sitting, Cuff Size: Large)   Pulse 82   Temp 98.6 F (37 C) (Oral)   Resp 18   Ht 6\' 3"  (1.905 m)   Wt 276 lb 6.4 oz (125.4 kg)   SpO2 98%   BMI 34.55 kg/m   Wt Readings from Last 3 Encounters:  02/16/18 276 lb 6.4 oz (125.4 kg)  02/16/18 279 lb 12.8 oz (126.9 kg)  09/29/17 281 lb (127.5 kg)     Physical Exam  Constitutional: He appears well-developed. He is active and cooperative.  Non-toxic appearance.  Cardiovascular: Normal rate, regular rhythm, S1 normal, S2 normal, normal heart sounds, intact distal pulses and normal pulses. Exam reveals no gallop and no friction rub.  No murmur heard. Pulmonary/Chest: Effort normal. No stridor. No tachypnea. No respiratory distress. He has no wheezes. He has no rales.  Abdominal: He exhibits no distension.  Musculoskeletal: He exhibits no edema.  Neurological: He is alert.  Skin: Skin is warm and dry. He is not diaphoretic. No pallor.  Vitals reviewed.   Results for orders placed or performed in visit on 02/16/18 (from the past 72 hour(s))  POCT glycosylated hemoglobin (Hb A1C)     Status: None   Collection Time: 02/16/18  4:18 PM  Result Value Ref Range   Hemoglobin A1C  10.4   Renal Function Panel     Status: Abnormal   Collection Time: 02/16/18  4:48 PM  Result Value Ref Range   Glucose 255 (H) 65 - 99 mg/dL   BUN 12 6 - 24 mg/dL   Creatinine, Ser 1.61 (L) 0.76 - 1.27 mg/dL   GFR calc non Af Amer 121 >59 mL/min/1.73   GFR calc Af Amer 140 >59 mL/min/1.73   BUN/Creatinine Ratio 19 9 - 20   Sodium 141 134 - 144 mmol/L   Potassium 3.9 3.5 - 5.2 mmol/L   Chloride 103 96 - 106 mmol/L   CO2 22 20 - 29 mmol/L   Calcium 9.0 8.7 - 10.2 mg/dL   Phosphorus 3.2 2.5 - 4.5 mg/dL   Albumin 4.2 3.5 - 5.5 g/dL  Lipid Panel     Status:  Abnormal   Collection Time: 02/16/18  4:48 PM  Result Value Ref Range   Cholesterol, Total 185 100 - 199 mg/dL   Triglycerides 096 (H) 0 - 149 mg/dL   HDL 35 (L) >04 mg/dL   VLDL Cholesterol Cal 44 (H) 5 - 40 mg/dL   LDL Calculated 540 (H) 0 - 99 mg/dL   Chol/HDL Ratio 5.3 (H) 0.0 - 5.0 ratio    Comment:                                   T. Chol/HDL Ratio                                             Men  Women                               1/2 Avg.Risk  3.4    3.3                                   Avg.Risk  5.0    4.4                                2X Avg.Risk  9.6    7.1                                3X Avg.Risk 23.4   11.0    Lab Results  Component Value Date   WBC 10.1 08/06/2017   HGB 14.7 08/06/2017   HCT 42.8 08/06/2017   MCV 85 08/06/2017   PLT 277 08/06/2017    Lab Results  Component Value Date   CREATININE 0.64 (L) 02/16/2018   BUN 12 02/16/2018   NA 141 02/16/2018   K 3.9 02/16/2018   CL 103 02/16/2018   CO2 22 02/16/2018    Lab Results  Component Value Date   ALT 22 08/06/2017   AST 21 08/06/2017   ALKPHOS 72 08/06/2017   BILITOT 0.2 08/06/2017    Lab Results  Component Value Date   TSH 0.678 08/06/2017    Lab Results  Component Value Date   HGBA1C 10.4 02/16/2018    Lab Results  Component Value Date   CHOL  185 02/16/2018   HDL 35 (L) 02/16/2018   LDLCALC 106 (H) 02/16/2018   TRIG 218 (H) 02/16/2018   CHOLHDL 5.3 (H) 02/16/2018   The 10-year ASCVD risk score Denman George DC Jr., et al., 2013) is: 22%   Values used to calculate the score:     Age: 37 years     Sex: Male     Is Non-Hispanic African American: Yes     Diabetic: Yes     Tobacco smoker: Yes     Systolic Blood Pressure: 138 mmHg     Is BP treated: Yes     HDL Cholesterol: 35 mg/dL     Total Cholesterol: 185 mg/dL  ASSESSMENT AND PLAN:  Kevin Roy was seen today for hypertension and follow-up.  Diagnoses and all orders for this visit:  At risk for acute ischemic cardiac event:  22% risk of a ASCVD in the next 10 years.  He is a smoker with a mild dyslipidemia, newly diagnosed diabetic with a history of well-controlled hypertension on Norvasc 10 mg daily with medication compliance.  I have no doubts that he will be compliant with Crestor, aspirin, metformin.  He wants to go to diabetes education.  Referrals will call him with the option that is most convenient for him.  I have encouraged him to start walking as outline on his after visit summary roughly 20-30 minutes a day at a moderate pace.  I will see him back in about 1 month for medication recheck.  If he has made some significant changes to his diet at that time I will probably recheck his A1c as a big improvement here could serve as a catalyst for change.  We have had a discussion about possibly starting him on Wellbutrin to help him with smoking cessation.  He will give this some thought.  At his next visit I will plan to provide him with a prescription for blood glucose monitoring, Will check his urine microalbumin, will provide a pneumococcal 23 vaccination.  I will also refer him to ophthalmology at that time and will complete his foot exam.       -    rosuvastatin (CRESTOR) 20 MG tablet; Take 1 tablet (20 mg total) by mouth       daily. -     aspirin (SB LOW DOSE ASA EC) 81 MG EC tablet; Take 1 tablet (81 mg total) by mouth daily. Swallow whole.  Essential hypertension: Well-controlled -     Renal Function Panel  Type 2 diabetes mellitus without complication, without long-term current use of insulin (HCC) -     POCT glycosylated hemoglobin (Hb A1C) -     Lipid Panel -     metFORMIN (GLUCOPHAGE) 500 MG tablet; Start by taking 1 in the morning and 1 at night for a week.  Increase to 2 tabs in the morning and night thereafter. -     Ambulatory referral to diabetic education  Smoker see problem 1    The patient is advised to call or return to clinic if he does not see an improvement in symptoms, or to seek the care  of the closest emergency department if he worsens with the above plan.   Deliah Boston, MHS, PA-C [facility] at [facility] 02/17/2018 1:38 PM

## 2018-02-17 LAB — RENAL FUNCTION PANEL
Albumin: 4.2 g/dL (ref 3.5–5.5)
BUN/Creatinine Ratio: 19 (ref 9–20)
BUN: 12 mg/dL (ref 6–24)
CALCIUM: 9 mg/dL (ref 8.7–10.2)
CO2: 22 mmol/L (ref 20–29)
Chloride: 103 mmol/L (ref 96–106)
Creatinine, Ser: 0.64 mg/dL — ABNORMAL LOW (ref 0.76–1.27)
GFR calc Af Amer: 140 mL/min/{1.73_m2} (ref >59)
GFR calc non Af Amer: 121 mL/min/{1.73_m2} (ref >59)
GLUCOSE: 255 mg/dL — AB (ref 65–99)
PHOSPHORUS: 3.2 mg/dL (ref 2.5–4.5)
POTASSIUM: 3.9 mmol/L (ref 3.5–5.2)
SODIUM: 141 mmol/L (ref 134–144)

## 2018-02-17 LAB — LIPID PANEL
CHOL/HDL RATIO: 5.3 ratio — AB (ref 0.0–5.0)
CHOLESTEROL TOTAL: 185 mg/dL (ref 100–199)
HDL: 35 mg/dL — ABNORMAL LOW (ref >39)
LDL Calculated: 106 mg/dL — ABNORMAL HIGH (ref 0–99)
TRIGLYCERIDES: 218 mg/dL — AB (ref 0–149)
VLDL Cholesterol Cal: 44 mg/dL — ABNORMAL HIGH (ref 5–40)

## 2018-02-24 ENCOUNTER — Encounter: Payer: Self-pay | Admitting: Physician Assistant

## 2018-02-25 DIAGNOSIS — G4733 Obstructive sleep apnea (adult) (pediatric): Secondary | ICD-10-CM | POA: Diagnosis not present

## 2018-03-19 ENCOUNTER — Ambulatory Visit: Payer: BLUE CROSS/BLUE SHIELD | Admitting: Physician Assistant

## 2018-03-19 ENCOUNTER — Other Ambulatory Visit: Payer: Self-pay

## 2018-03-19 ENCOUNTER — Encounter: Payer: Self-pay | Admitting: Physician Assistant

## 2018-03-19 VITALS — BP 130/94 | HR 88 | Temp 98.3°F | Resp 16 | Ht 72.0 in | Wt 262.0 lb

## 2018-03-19 DIAGNOSIS — E119 Type 2 diabetes mellitus without complications: Secondary | ICD-10-CM | POA: Diagnosis not present

## 2018-03-19 LAB — POCT GLYCOSYLATED HEMOGLOBIN (HGB A1C): HEMOGLOBIN A1C: 8.1

## 2018-03-19 NOTE — Patient Instructions (Addendum)
  COme back in two months.  Keep up the great work.     IF you received an x-ray today, you will receive an [facility]. Please contact [facility] at [phone] with questions or concerns regarding your invoice.   IF you received labwork today, you will receive an invoice from [facility]. Please contact [facility] at [phone] with questions or concerns regarding your invoice.   Our billing staff will not be able to assist you with questions regarding bills from these companies.  You will be contacted with the lab results as soon as they are available. The fastest way to get your results is to activate your My Chart account. Instructions are located on the last page of this paperwork. If you have not heard from us regarding the results in 2 weeks, please contact this office.

## 2018-03-19 NOTE — Progress Notes (Signed)
03/19/2018 10:48 AM   DOB: 1976-03-13 / MRN: [id number]  SUBJECTIVE:  Kevin Roy is a 42 y.o. male presenting for A1c recheck.  Patient recently diagnosed with diabetes roughly 1 month ago.  He is compliant with all of my recommendations and says he feels well today.  He continues to work on smoking cessation and his weight is down 15 pounds or so.  He has No Known Allergies.   He  has a past medical history of Hypertension and OSA on CPAP.    He  reports that he has been smoking cigarettes.  He has a 25.00 pack-year smoking history. He has never used smokeless tobacco. He reports that he drinks about 0.6 oz of alcohol per week. He reports that he does not use drugs. He  has no sexual activity history on file. The patient  has no past surgical history on file.  His family history includes Cancer in his father; Hyperlipidemia in his maternal grandmother and mother; Hypertension in his mother.  Review of Systems  Constitutional: Negative for chills, diaphoresis and fever.  Eyes: Negative.   Respiratory: Negative for cough, hemoptysis, sputum production, shortness of breath and wheezing.   Cardiovascular: Negative for chest pain, orthopnea and leg swelling.  Gastrointestinal: Negative for abdominal pain, blood in stool, constipation, diarrhea, heartburn, melena, nausea and vomiting.  Genitourinary: Negative for flank pain.  Skin: Negative for rash.  Neurological: Negative for dizziness, sensory change, speech change, focal weakness and headaches.    The problem list and medications were reviewed and updated by myself where necessary and exist elsewhere in the encounter.   OBJECTIVE:  BP (!) 130/94 (BP Location: Right Arm)   Pulse 88   Temp 98.3 F (36.8 C) (Oral)   Resp 16   Ht 6' (1.829 m)   Wt 262 lb (118.8 kg)   SpO2 97%   BMI 35.53 kg/m   BP Readings from Last 3 Encounters:  03/19/18 (!) 130/94  02/16/18 138/82  02/16/18 (!) 145/95   Wt Readings from Last 3  Encounters:  03/19/18 262 lb (118.8 kg)  02/16/18 276 lb 6.4 oz (125.4 kg)  02/16/18 279 lb 12.8 oz (126.9 kg)      Physical Exam  Constitutional: He is oriented to person, place, and time. He appears well-developed. He does not appear ill.  Eyes: Pupils are equal, round, and reactive to light. Conjunctivae and EOM are normal.  Cardiovascular: Normal rate, regular rhythm, S1 normal, S2 normal, normal heart sounds, intact distal pulses and normal pulses. Exam reveals no gallop and no friction rub.  No murmur heard. Pulmonary/Chest: Effort normal. No stridor. No respiratory distress. He has no wheezes. He has no rales.  Abdominal: He exhibits no distension.  Musculoskeletal: Normal range of motion. He exhibits no edema.  Neurological: He is alert and oriented to person, place, and time. No cranial nerve deficit. Coordination normal.  Skin: Skin is warm and dry. He is not diaphoretic.  Psychiatric: He has a normal mood and affect.  Nursing note and vitals reviewed.   Results for orders placed or performed in visit on 03/19/18 (from the past 72 hour(s))  POCT glycosylated hemoglobin (Hb A1C)     Status: None   Collection Time: 03/19/18 10:48 AM  Result Value Ref Range   Hemoglobin A1C 8.1     No results found.  ASSESSMENT AND PLAN:  Kevin Roy was seen today for diabetes.  Diagnoses and all orders for this visit:  Type 2 diabetes mellitus without  complication, without long-term current use of insulin (HCC) -     POCT glycosylated hemoglobin (Hb A1C)    The patient is advised to call or return to clinic if he does not see an improvement in symptoms, or to seek the care of the closest emergency department if he worsens with the above plan.   Deliah Boston, MHS, PA-C Primary Care at [facility] 03/19/2018 10:48 AM

## 2018-03-24 ENCOUNTER — Ambulatory Visit: Payer: BLUE CROSS/BLUE SHIELD | Admitting: Registered"

## 2018-03-28 DIAGNOSIS — G4733 Obstructive sleep apnea (adult) (pediatric): Secondary | ICD-10-CM | POA: Diagnosis not present

## 2018-03-31 ENCOUNTER — Ambulatory Visit: Payer: Managed Care, Other (non HMO)

## 2018-04-07 ENCOUNTER — Ambulatory Visit: Payer: Managed Care, Other (non HMO)

## 2018-04-14 ENCOUNTER — Ambulatory Visit: Payer: Managed Care, Other (non HMO)

## 2018-04-21 ENCOUNTER — Ambulatory Visit: Payer: Managed Care, Other (non HMO)

## 2018-04-26 ENCOUNTER — Ambulatory Visit: Payer: Managed Care, Other (non HMO) | Admitting: *Deleted

## 2018-04-27 DIAGNOSIS — G4733 Obstructive sleep apnea (adult) (pediatric): Secondary | ICD-10-CM | POA: Diagnosis not present

## 2018-04-28 ENCOUNTER — Ambulatory Visit: Payer: Managed Care, Other (non HMO)

## 2018-05-05 IMAGING — DX DG CHEST 2V
2 series · 3 of 3 positions shown · non-contrast
Comparison: None.

CLINICAL DATA: Flu like symptoms.

EXAM:
CHEST  2 VIEW

[chest pa]
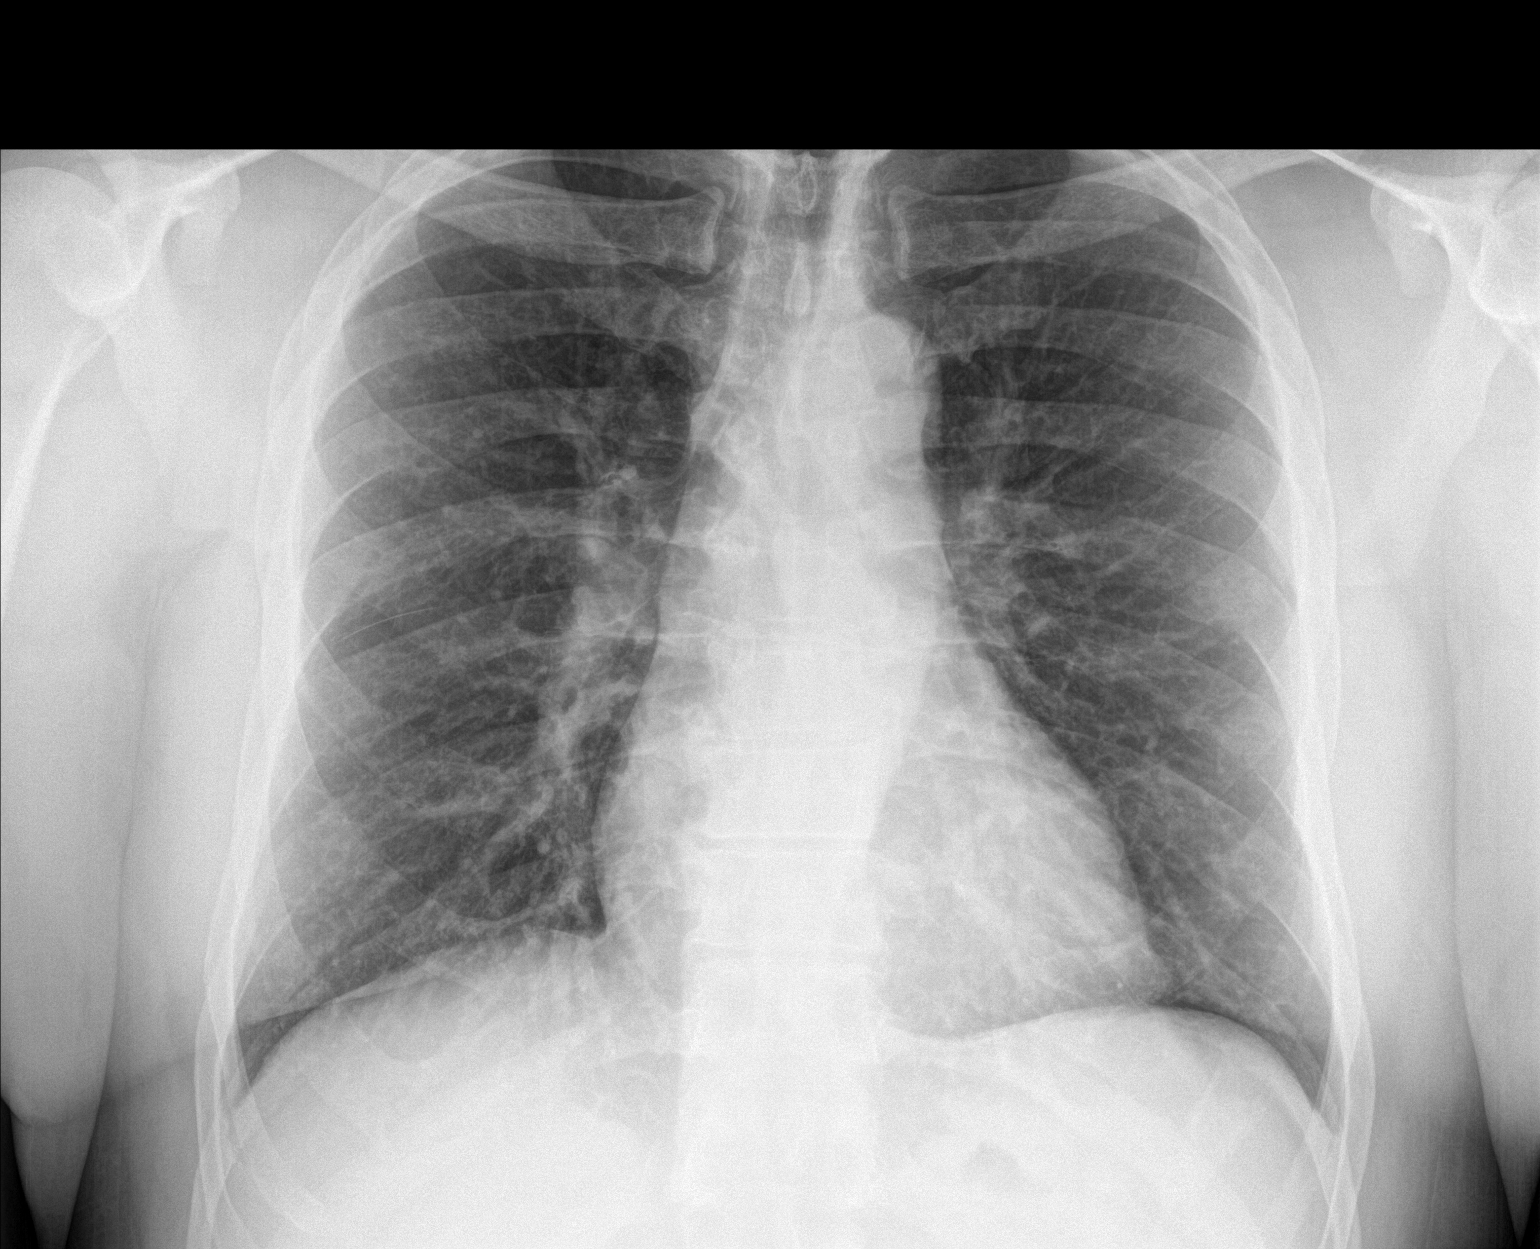

[Series 2: chest lat · 0.14mm/px · 2 of 2 slices shown]
[im 1/2]
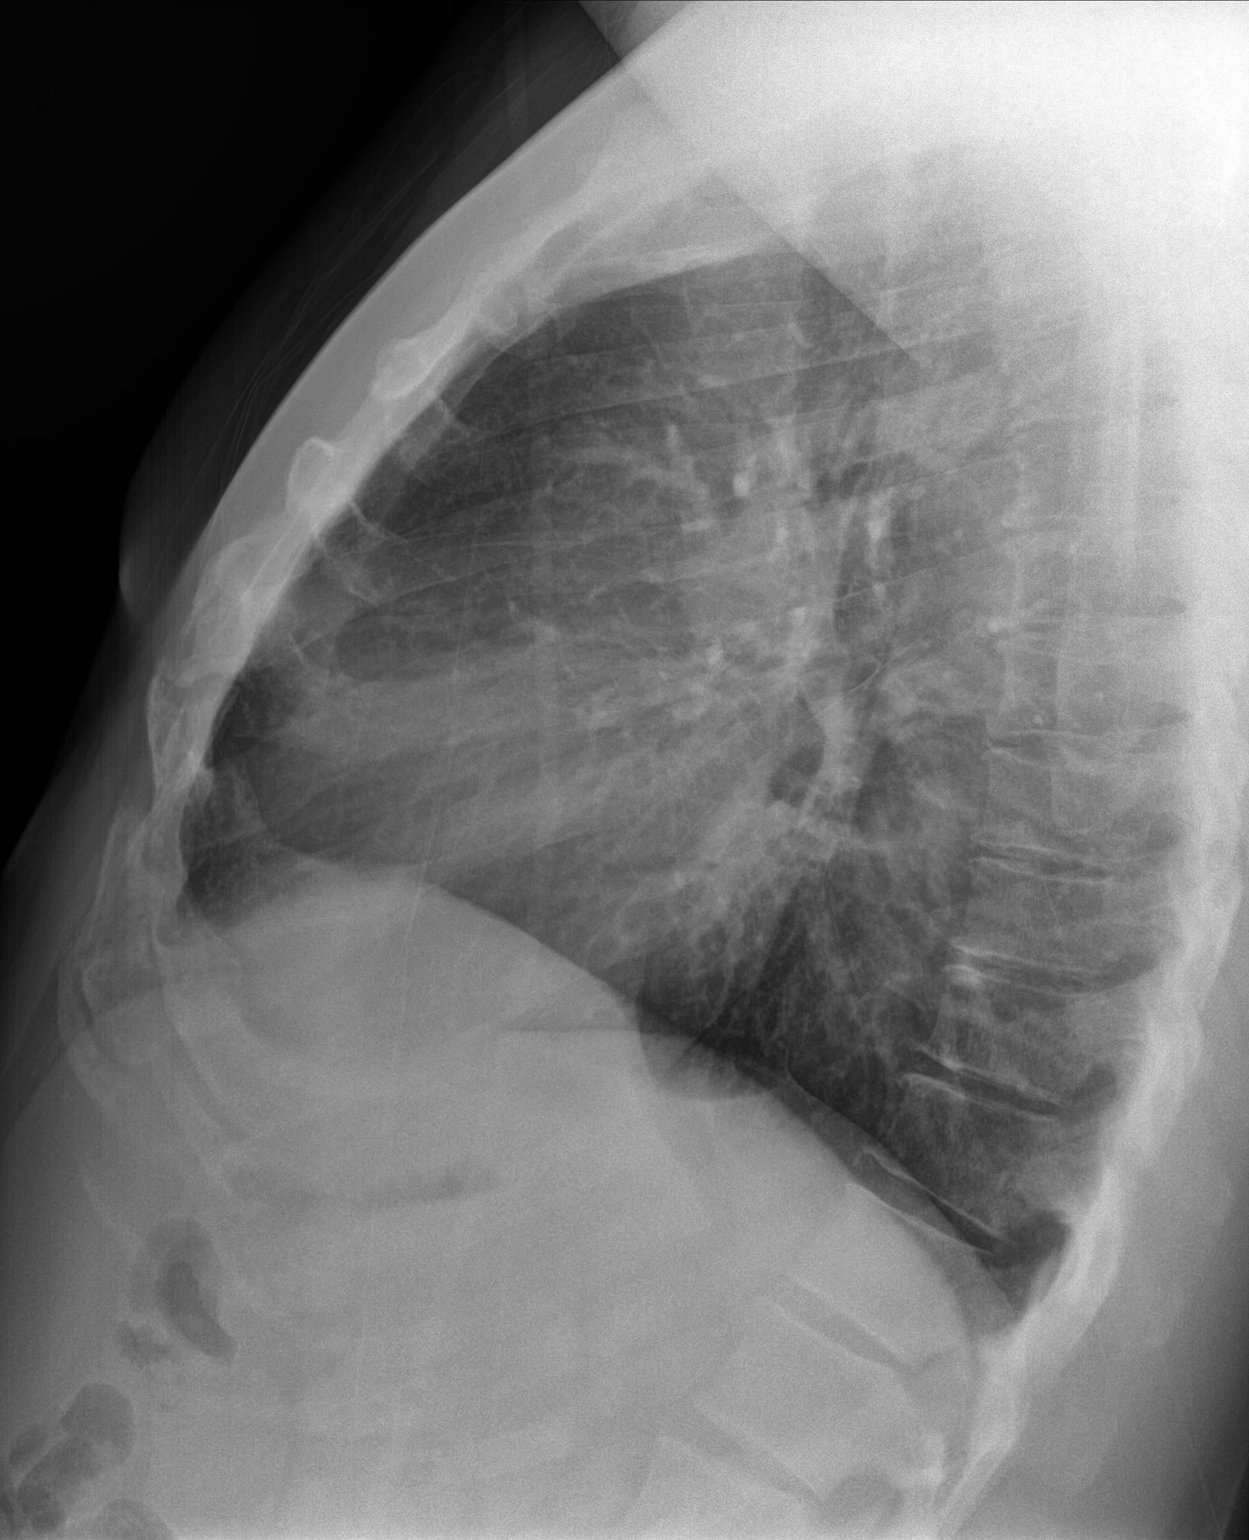
[im 2/2]
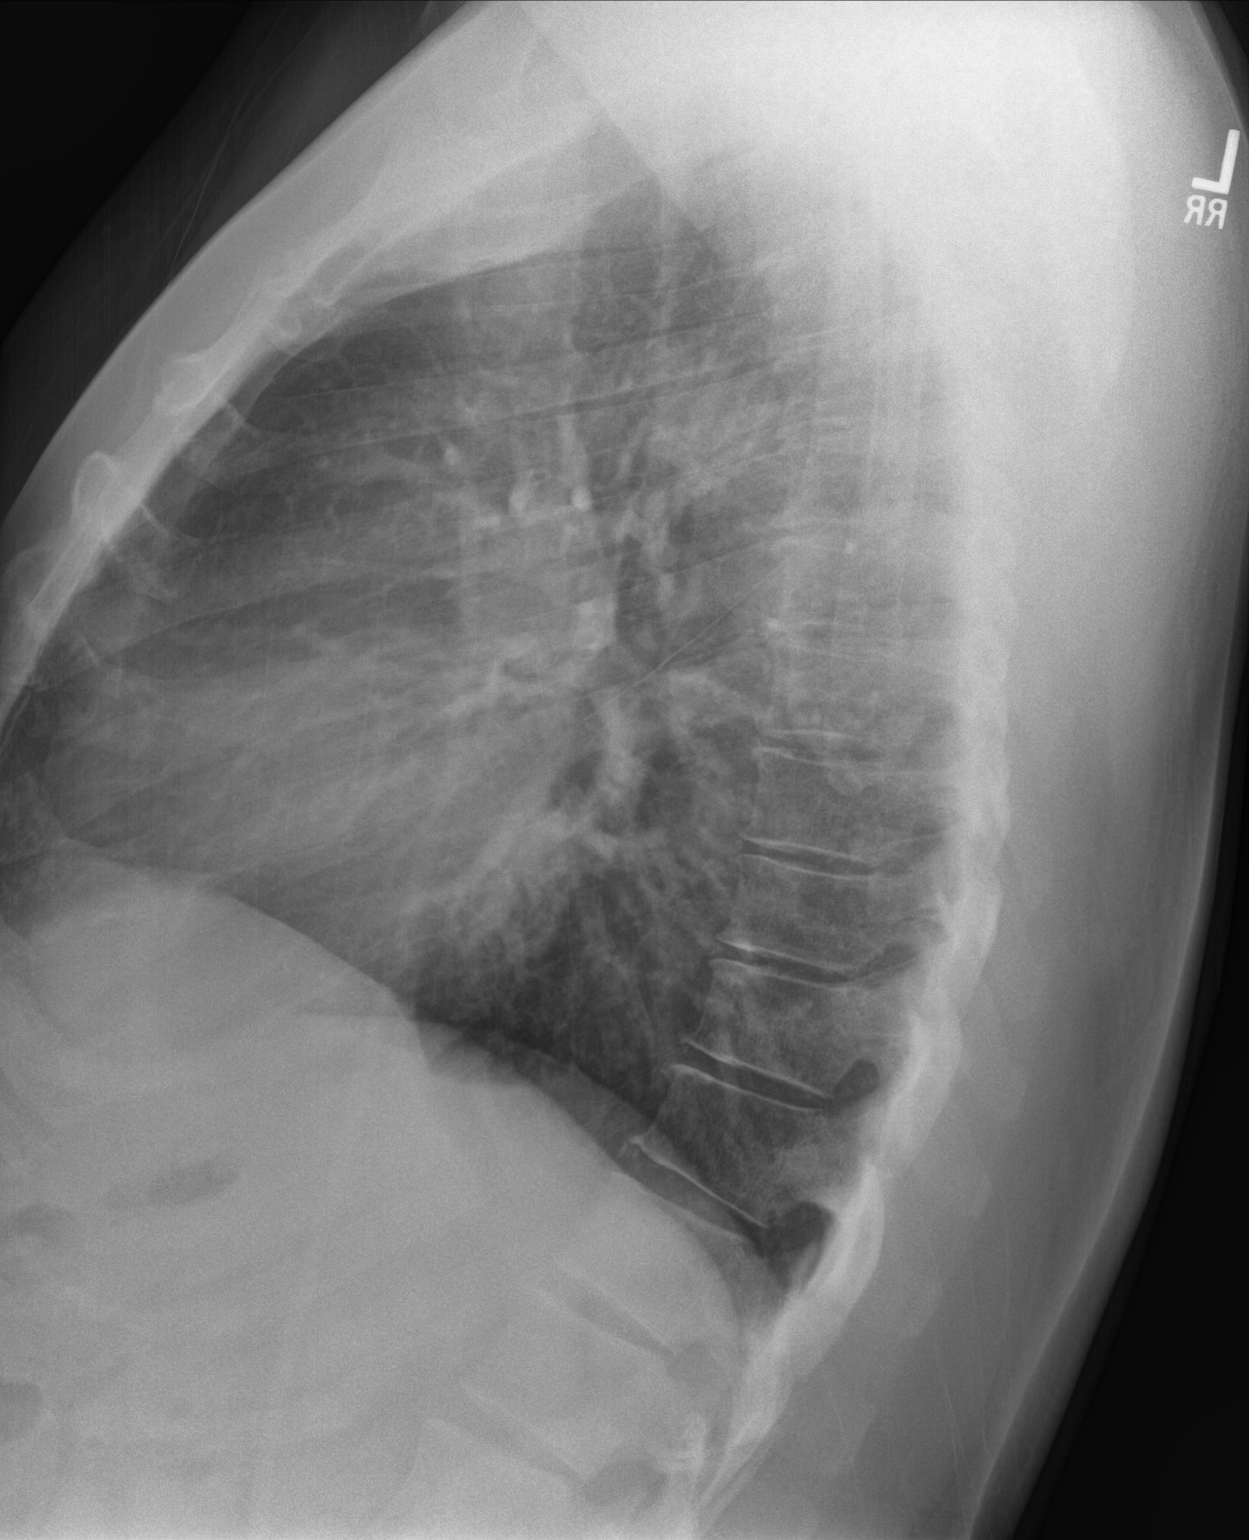

[3 of 3 positions shown; findings below may reference images not displayed]

FINDINGS: The heart size and mediastinal contours are within normal limits.
Both lungs are clear. The visualized skeletal structures are
unremarkable.
IMPRESSION: No active cardiopulmonary disease.

## 2018-05-18 ENCOUNTER — Encounter: Payer: Self-pay | Admitting: Physician Assistant

## 2018-05-18 ENCOUNTER — Ambulatory Visit: Payer: BLUE CROSS/BLUE SHIELD | Admitting: Physician Assistant

## 2018-05-18 VITALS — BP 134/85 | HR 80 | Temp 98.8°F | Resp 17 | Ht 73.0 in | Wt 262.0 lb

## 2018-05-18 DIAGNOSIS — I10 Essential (primary) hypertension: Secondary | ICD-10-CM

## 2018-05-18 DIAGNOSIS — F17210 Nicotine dependence, cigarettes, uncomplicated: Secondary | ICD-10-CM

## 2018-05-18 DIAGNOSIS — E119 Type 2 diabetes mellitus without complications: Secondary | ICD-10-CM | POA: Diagnosis not present

## 2018-05-18 DIAGNOSIS — Z9189 Other specified personal risk factors, not elsewhere classified: Secondary | ICD-10-CM | POA: Diagnosis not present

## 2018-05-18 LAB — POCT GLYCOSYLATED HEMOGLOBIN (HGB A1C): Hemoglobin A1C: 6.1 % — AB (ref 4.0–5.6)

## 2018-05-18 MED ORDER — METFORMIN HCL 500 MG PO TABS: ORAL_TABLET | ORAL | 3 refills | Status: DC

## 2018-05-18 MED ORDER — ROSUVASTATIN CALCIUM 20 MG PO TABS: 20.0000 mg | ORAL_TABLET | Freq: Every day | ORAL | 3 refills | Status: DC

## 2018-05-18 MED ORDER — AMLODIPINE BESYLATE 10 MG PO TABS: 10.0000 mg | ORAL_TABLET | Freq: Every day | ORAL | 3 refills | Status: DC

## 2018-05-18 NOTE — Progress Notes (Signed)
05/19/2018 10:08 AM   DOB: 08-Apr-1976 / MRN: [id number]  SUBJECTIVE:  Kevin Roy is a 42 y.o. male presenting for recheck diabetes. Symptoms present for about 1 year.  The problem is improving. He has tried weight loss, diet, metformin.  He is compliant with blood pressure therapy along with statin and aspirin.  Continues to smoke however, smoking much less at one half a pack daily at this time with a previous he was smoking 2 packs a day.  He denies stocking glove paresthesia, chest pain, shortness of breath, leg swelling, dizziness, diaphoresis, DOE, vision changes.  He has No Known Allergies.   He  has a past medical history of Hypertension and OSA on CPAP.    He  reports that he has been smoking cigarettes.  He has a 12.50 pack-year smoking history. He has never used smokeless tobacco. He reports that he drinks about 0.6 oz of alcohol per week. He reports that he does not use drugs. He  has no sexual activity history on file. The patient  has no past surgical history on file.  His family history includes Cancer in his father; Hyperlipidemia in his maternal grandmother and mother; Hypertension in his mother.  ROS per HPI  The problem list and medications were reviewed and updated by myself where necessary and exist elsewhere in the encounter.   OBJECTIVE:  BP 134/85   Pulse 80   Temp 98.8 F (37.1 C) (Oral)   Resp 17   Ht 6\' 1"  (1.854 m)   Wt 262 lb (118.8 kg)   SpO2 98%   BMI 34.57 kg/m   Wt Readings from Last 3 Encounters:  05/18/18 262 lb (118.8 kg)  03/19/18 262 lb (118.8 kg)  02/16/18 276 lb 6.4 oz (125.4 kg)   Temp Readings from Last 3 Encounters:  05/18/18 98.8 F (37.1 C) (Oral)  03/19/18 98.3 F (36.8 C) (Oral)  02/16/18 98.6 F (37 C) (Oral)   BP Readings from Last 3 Encounters:  05/19/18 134/85  03/19/18 (!) 130/94  02/16/18 138/82   Pulse Readings from Last 3 Encounters:  05/18/18 80  03/19/18 88  02/16/18 82    Physical Exam    Constitutional: He is oriented to person, place, and time. He appears well-developed. He is active.  Non-toxic appearance. He does not appear ill.  Eyes: Pupils are equal, round, and reactive to light. Conjunctivae and EOM are normal.  Cardiovascular: Normal rate, regular rhythm, S1 normal, S2 normal, normal heart sounds, intact distal pulses and normal pulses. Exam reveals no gallop and no friction rub.  No murmur heard. Pulses:      Dorsalis pedis pulses are 2+ on the right side, and 2+ on the left side.       Posterior tibial pulses are 2+ on the right side, and 2+ on the left side.  Pulmonary/Chest: Effort normal. No stridor. No respiratory distress. He has no wheezes. He has no rales.  Abdominal: He exhibits no distension.  Musculoskeletal: Normal range of motion. He exhibits no edema.  Feet:  Right Foot:  Protective Sensation: 5 sites tested. 5 sites sensed.  Skin Integrity: Negative for ulcer, skin breakdown or callus.  Left Foot:  Protective Sensation: 5 sites sensed.  Skin Integrity: Negative for ulcer, skin breakdown or callus.  Neurological: He is alert and oriented to person, place, and time. No cranial nerve deficit. Coordination normal.  Skin: Skin is warm and dry. He is not diaphoretic. No pallor.  Psychiatric: He has a  normal mood and affect.  Nursing note and vitals reviewed.    Lab Results  Component Value Date   HGBA1C 6.1 (A) 05/18/2018    Lab Results  Component Value Date   WBC 10.1 08/06/2017   HGB 14.7 08/06/2017   HCT 42.8 08/06/2017   MCV 85 08/06/2017   PLT 277 08/06/2017    Lab Results  Component Value Date   CREATININE 0.64 (L) 02/16/2018   BUN 12 02/16/2018   NA 141 02/16/2018   K 3.9 02/16/2018   CL 103 02/16/2018   CO2 22 02/16/2018    Lab Results  Component Value Date   ALT 22 08/06/2017   AST 21 08/06/2017   ALKPHOS 72 08/06/2017   BILITOT 0.2 08/06/2017    Lab Results  Component Value Date   TSH 0.678 08/06/2017    Lab  Results  Component Value Date   CHOL 121 05/18/2018   HDL 46 05/18/2018   LDLCALC 56 05/18/2018   TRIG 93 05/18/2018   CHOLHDL 2.6 05/18/2018     ASSESSMENT AND PLAN:  Jacquenette ShoneJulian was seen today for follow-up.  Diagnoses and all orders for this visit:  Type 2 diabetes mellitus without complication, without long-term current use of insulin (HCC) I could not be more pleased with his progress.  In fact we could probably consider reducing his metformin at this time however he wishes to continue his current dosage as he feels that it is helping him stay on track. -     POCT glycosylated hemoglobin (Hb A1C) -     Lipid panel -     Urinalysis, dipstick only -     Microalbumin, urine -     metFORMIN (GLUCOPHAGE) 500 MG tablet; Start by taking 1 in the morning and 1 at night for a week.  Increase to 2 tabs in the morning and night thereafter.  Essential hypertension: Well-controlled.  We could consider an ACE however I think if he keeps up his current progress he will likely become nondiabetic. -     amLODipine (NORVASC) 10 MG tablet; Take 1 tablet (10 mg total) by mouth daily.  At risk for acute ischemic cardiac event: He continues to smoke however does desire cessation.  Of advised that he call me anytime if he decides he wants to try Wellbutrin, Chantix, patches or gum.  I really tried to urge him to quit today. -     rosuvastatin (CRESTOR) 20 MG tablet; Take 1 tablet (20 mg total) by mouth daily.  Cigarette smoker: See problem 3.    The patient is advised to call or return to clinic if he does not see an improvement in symptoms, or to seek the care of the closest emergency department if he worsens with the above plan.   Deliah BostonMichael Thandiwe Siragusa, MHS, PA-C Primary Care at [facility] 05/19/2018 10:08 AM

## 2018-05-18 NOTE — Patient Instructions (Addendum)
  Continue all of your meds.  You are doing great. Let me know if there is anything I can do to help you quit let me know.  We have chantix, wellbutrin, patches, gum.    IF you received an x-ray today, you will receive an invoice from [facility]. Please contact [facility] at [phone] with questions or concerns regarding your invoice.   IF you received labwork today, you will receive an invoice from [facility]. Please contact [facility] at [phone] with questions or concerns regarding your invoice.   Our billing staff will not be able to assist you with questions regarding bills from these companies.  You will be contacted with the lab results as soon as they are available. The fastest way to get your results is to activate your My Chart account. Instructions are located on the last page of this paperwork. If you have not heard from us regarding the results in 2 weeks, please contact this office.

## 2018-05-19 LAB — LIPID PANEL
CHOL/HDL RATIO: 2.6 ratio (ref 0.0–5.0)
Cholesterol, Total: 121 mg/dL (ref 100–199)
HDL: 46 mg/dL (ref >39)
LDL CALC: 56 mg/dL (ref 0–99)
TRIGLYCERIDES: 93 mg/dL (ref 0–149)
VLDL Cholesterol Cal: 19 mg/dL (ref 5–40)

## 2018-05-19 LAB — URINALYSIS, DIPSTICK ONLY
Bilirubin, UA: NEGATIVE
GLUCOSE, UA: NEGATIVE
KETONES UA: NEGATIVE
LEUKOCYTES UA: NEGATIVE
Nitrite, UA: NEGATIVE
Protein, UA: NEGATIVE
RBC UA: NEGATIVE
SPEC GRAV UA: 1.017 (ref 1.005–1.030)
UUROB: 0.2 mg/dL (ref 0.2–1.0)
pH, UA: 7.5 (ref 5.0–7.5)

## 2018-05-19 LAB — MICROALBUMIN, URINE: Microalbumin, Urine: 12.9 ug/mL

## 2018-05-28 DIAGNOSIS — G4733 Obstructive sleep apnea (adult) (pediatric): Secondary | ICD-10-CM | POA: Diagnosis not present

## 2018-06-27 DIAGNOSIS — G4733 Obstructive sleep apnea (adult) (pediatric): Secondary | ICD-10-CM | POA: Diagnosis not present

## 2018-07-28 DIAGNOSIS — G4733 Obstructive sleep apnea (adult) (pediatric): Secondary | ICD-10-CM | POA: Diagnosis not present

## 2018-08-01 ENCOUNTER — Other Ambulatory Visit: Payer: Self-pay | Admitting: Physician Assistant

## 2018-08-01 DIAGNOSIS — E119 Type 2 diabetes mellitus without complications: Secondary | ICD-10-CM

## 2018-08-02 NOTE — Telephone Encounter (Signed)
Metformin 500 mg refill Last Refill: 05/18/18 # 180 with 3 refills Last OV: 05/18/18 PCP: formerly Kevin Roy , next appt with Dr. Alvy Roy Pharmacy:[facility] [phone]  To soon for a refill.

## 2018-08-24 NOTE — Progress Notes (Deleted)
[facility]  PATIENT: Kevin Roy DOB: 11-10-1976   REASON FOR VISIT: Follow-up for newly diagnosed obstructive sleep apnea with initial CPAP compliance  HISTORY FROM: Patient    HISTORY OF PRESENT ILLNESS:UPDATE 3/20/2019CM [name], 42 year old male returns for follow-up with newly diagnosed obstructive sleep apnea here for  initial CPAP compliance.  He has had no problems getting used to his machine.  He recently had his mask changed for a better fit.  Compliance data dated 01/17/2018-02/15/2018 shows compliance greater than 4 hours at 30 days at 100%.  Average usage 6 hours 54 minutes.  Set pressure 5-15 cm.  EPR 3 AHI 1.6.  He returns for reevaluation  10/31/18CDJulian Fauth is a 42 y.o. male , seen here as in a referral from [facility] for evaluation of possible sleep apnea, patient is reportedly snoring loudly and breathing irregularly at night. Mr. Glasheen wife has actually recorded his snoring and his apneas and he was surprised to note it was.  He also has hypertension and has been treated with  medication, currently taking Norvasc. He was diagnosed as borderline diabetic and changed his diet.  He is unsure as to hypercholesterolemia being present.  He does feel fatigued and that his sleep is not as restorative and refreshing adequately.  He is currently still smoking, he does not have a history of coronary artery disease, cardiomyopathy, chest pain.  His blood pressures have run in the 150s and 160s systolic throughout his referring notes.   Sleep habits are as follows: The patient's bedtime is usually 10 PM, after watching TV in the evenings.  He describes his bedroom is cool, quiet and dark.  He prefers to sleep on his side, supported by 2 pillows.  He shares a bed with his wife and his dog.  The dog is snoring. He is always asleep very promptly but he does wake up to use the bathroom, usually at 2 AM, and not again until his wife leaves the house at about 4  AM. He usually wakes up when his wife goes to work, and does not go back to sleep.  The patient works between 6 AM and 4 PM and will take an afternoon nap after returning home.  He sleeps frequently on weekends, napping for 30-240 minutes.  He feels equally restored and refreshed by a nap or by his nocturnal sleep.  He usually sleeps for about 5 hours REVIEW OF SYSTEMS: Full 14 system review of systems performed and notable only for those listed, all others are neg:  Constitutional: neg  Cardiovascular: neg Ear/Nose/Throat: neg  Skin: neg Eyes: neg Respiratory: neg Gastroitestinal: neg  Hematology/Lymphatic: neg  Endocrine: neg Musculoskeletal:neg Allergy/Immunology: neg Neurological: neg Psychiatric: neg Sleep : Obstructive sleep apnea with CPAP   ALLERGIES: No Known Allergies  HOME MEDICATIONS: Outpatient Medications Prior to Visit  Medication Sig Dispense Refill  . amLODipine (NORVASC) 10 MG tablet Take 1 tablet (10 mg total) by mouth daily. 90 tablet 3  . aspirin (SB LOW DOSE ASA EC) 81 MG EC tablet Take 1 tablet (81 mg total) by mouth daily. Swallow whole. 90 tablet 3  . metFORMIN (GLUCOPHAGE) 500 MG tablet Start by taking 1 in the morning and 1 at night for a week.  Increase to 2 tabs in the morning and night thereafter. 180 tablet 3  . metFORMIN (GLUCOPHAGE) 500 MG tablet START TAKING 1 TABLET IN THE MORNING AND 1 AT NIGHT FOR 1 WEEK, INCREASE TO 2 TABLETS IN THE MORNING AND  NIGHT THEREAFTER 180 tablet 0  . rosuvastatin (CRESTOR) 20 MG tablet Take 1 tablet (20 mg total) by mouth daily. 90 tablet 3   No facility-administered medications prior to visit.     PAST MEDICAL HISTORY: Past Medical History:  Diagnosis Date  . Hypertension   . OSA on CPAP     PAST SURGICAL HISTORY: No past surgical history on file.  FAMILY HISTORY: Family History  Problem Relation Age of Onset  . Hypertension Mother   . Hyperlipidemia Mother   . Cancer Father   . Hyperlipidemia  Maternal Grandmother     SOCIAL HISTORY: Social History   Socioeconomic History  . Marital status: Single    Spouse name: Not on file  . Number of children: Not on file  . Years of education: Not on file  . Highest education level: Not on file  Occupational History  . Not on file  Social Needs  . Financial resource strain: Not on file  . Food insecurity:    Worry: Not on file    Inability: Not on file  . Transportation needs:    Medical: Not on file    Non-medical: Not on file  Tobacco Use  . Smoking status: Current Every Day Smoker    Packs/day: 0.50    Years: 25.00    Pack years: 12.50    Types: Cigarettes  . Smokeless tobacco: Never Used  . Tobacco comment: 02/16/18 1/2 PPD  Substance and Sexual Activity  . Alcohol use: Yes    Alcohol/week: 1.0 standard drinks    Types: 1 Cans of beer per week    Comment: OCCASSIONALY  . Drug use: No  . Sexual activity: Not on file  Lifestyle  . Physical activity:    Days per week: Not on file    Minutes per session: Not on file  . Stress: Not on file  Relationships  . Social connections:    Talks on phone: Not on file    Gets together: Not on file    Attends religious service: Not on file    Active member of club or organization: Not on file    Attends meetings of clubs or organizations: Not on file    Relationship status: Not on file  . Intimate partner violence:    Fear of current or ex partner: Not on file    Emotionally abused: Not on file    Physically abused: Not on file    Forced sexual activity: Not on file  Other Topics Concern  . Not on file  Social History Narrative  . Not on file     PHYSICAL EXAM  There were no vitals filed for this visit. There is no height or weight on file to calculate BMI.  Generalized: Well developed, obese male in no acute distress  Head: normocephalic and atraumatic,. Oropharynx benign mallopatti 4 Neck: Supple, circumference 19 Musculoskeletal: No deformity   Neurological  examination   Mentation: Alert oriented to time, place, history taking. Attention span and concentration appropriate. Recent and remote memory intact.  Follows all commands speech and language fluent.   Cranial nerve II-XII: Pupils were equal round reactive to light extraocular movements were full, visual field were full on confrontational test. Facial sensation and strength were normal. hearing was intact to finger rubbing bilaterally. Uvula tongue midline. head turning and shoulder shrug were normal and symmetric.Tongue protrusion into cheek strength was normal. Motor: normal bulk and tone, full strength in the BUE, BLE, Sensory: normal and symmetric  to light touch,  Coordination: finger-nose-finger, heel-to-shin bilaterally, no dysmetria Gait and Station: Rising up from seated position without assistance, normal stance,  moderate stride, good arm swing, smooth turning, able to perform tiptoe, and heel walking without difficulty. Tandem gait is steady  DIAGNOSTIC DATA (LABS, IMAGING, TESTING) - I reviewed patient records, labs, notes, testing and imaging myself where available.  Lab Results  Component Value Date   WBC 10.1 08/06/2017   HGB 14.7 08/06/2017   HCT 42.8 08/06/2017   MCV 85 08/06/2017   PLT 277 08/06/2017      Component Value Date/Time   NA 141 02/16/2018 1648   K 3.9 02/16/2018 1648   CL 103 02/16/2018 1648   CO2 22 02/16/2018 1648   GLUCOSE 255 (H) 02/16/2018 1648   GLUCOSE 98 12/31/2016 1957   BUN 12 02/16/2018 1648   CREATININE 0.64 (L) 02/16/2018 1648   CALCIUM 9.0 02/16/2018 1648   PROT 7.2 08/06/2017 1632   ALBUMIN 4.2 02/16/2018 1648   AST 21 08/06/2017 1632   ALT 22 08/06/2017 1632   ALKPHOS 72 08/06/2017 1632   BILITOT 0.2 08/06/2017 1632   GFRNONAA 121 02/16/2018 1648   GFRAA 140 02/16/2018 1648    Lab Results  Component Value Date   HGBA1C 6.1 (A) 05/18/2018   No results found for: VITAMINB12 Lab Results  Component Value Date   TSH 0.678  08/06/2017      ASSESSMENT AND PLAN  42 y.o. year old male  has a past medical history of Hypertension and OSA on CPAP.  Here to follow-up for his initial CPAP compliance.Data dated 01/17/2018-02/15/2018 shows compliance greater than 4 hours at 30 days at 100%.  Average usage 6 hours 54 minutes.  Set pressure 5-15 cm.  EPR 3 AHI 1.6.    PLAN: CPAP compliance 100%, reviewed data with patient Continue same settings Follow-up in 6 months Nilda Riggs, [facility], [facility], APRN  [facility] [address] [place], [place] [address] [phone]

## 2018-08-29 ENCOUNTER — Telehealth: Payer: Self-pay | Admitting: *Deleted

## 2018-08-29 ENCOUNTER — Ambulatory Visit: Payer: BLUE CROSS/BLUE SHIELD | Admitting: Nurse Practitioner

## 2018-08-29 NOTE — Telephone Encounter (Signed)
Pt no showed appt today

## 2018-08-30 ENCOUNTER — Encounter: Payer: Self-pay | Admitting: Nurse Practitioner

## 2018-10-18 DIAGNOSIS — G4733 Obstructive sleep apnea (adult) (pediatric): Secondary | ICD-10-CM | POA: Diagnosis not present

## 2018-11-03 NOTE — Progress Notes (Signed)
Established Patient Office Visit  Subjective:  Patient ID: Kevin Roy, male    DOB: 05/06/1976  Age: 42 y.o. MRN: [id number]  CC:  Chief Complaint  Patient presents with  . URI    onset: Wednesday at work when leaving pt c/o temp fo 99.6 up to 100.4, sweated all night and fever broke on thursday, per pt he is still coughing and congestion, mucus drainage is tannish in color, and unable to taste.  Taking otc dayquil and cold ez lozengers.      HPI Kevin Roy presents for  Acute URI This is a patient who presents today for flu like symptoms He has HTN and diabetes He reports that he has been taking nyquil and dayquil  His last temp was 100.4 2 days ago  He broke his fevers and had sweats He has a cough with tan colored sputum  Pt reports that he is ready to quit His mother died in 2019-09-01after CAD and was home at rest and developed PE and died States that he also had a friend that died from lung cancer States that he plans to quit 11/30/2018 He smokes for 21 years and has cut down from 1.5 pack a day to 0.5 pack a day   He has Type II DM that is well controlled Lab Results  Component Value Date   HGBA1C 6.1 (A) 05/18/2018   He reports that he is eating healthier and taking his metformin  Past Medical History:  Diagnosis Date  . Hypertension   . OSA on CPAP     No past surgical history on file.  Family History  Problem Relation Age of Onset  . Hypertension Mother   . Hyperlipidemia Mother   . Cancer Father   . Hyperlipidemia Maternal Grandmother     Social History   Socioeconomic History  . Marital status: Single    Spouse name: Not on file  . Number of children: Not on file  . Years of education: Not on file  . Highest education level: Not on file  Occupational History  . Not on file  Social Needs  . Financial resource strain: Not on file  . Food insecurity:    Worry: Not on file    Inability: Not on file  . Transportation needs:    Medical:  Not on file    Non-medical: Not on file  Tobacco Use  . Smoking status: Current Every Day Smoker    Packs/day: 0.50    Years: 25.00    Pack years: 12.50    Types: Cigarettes  . Smokeless tobacco: Never Used  . Tobacco comment: 02/16/18 1/2 PPD  Substance and Sexual Activity  . Alcohol use: Yes    Alcohol/week: 1.0 standard drinks    Types: 1 Cans of beer per week    Comment: OCCASSIONALY  . Drug use: No  . Sexual activity: Not on file  Lifestyle  . Physical activity:    Days per week: Not on file    Minutes per session: Not on file  . Stress: Not on file  Relationships  . Social connections:    Talks on phone: Not on file    Gets together: Not on file    Attends religious service: Not on file    Active member of club or organization: Not on file    Attends meetings of clubs or organizations: Not on file    Relationship status: Not on file  . Intimate partner violence:  Fear of current or ex partner: Not on file    Emotionally abused: Not on file    Physically abused: Not on file    Forced sexual activity: Not on file  Other Topics Concern  . Not on file  Social History Narrative  . Not on file    Outpatient Medications Prior to Visit  Medication Sig Dispense Refill  . amLODipine (NORVASC) 10 MG tablet Take 1 tablet (10 mg total) by mouth daily. 90 tablet 3  . aspirin (SB LOW DOSE ASA EC) 81 MG EC tablet Take 1 tablet (81 mg total) by mouth daily. Swallow whole. 90 tablet 3  . metFORMIN (GLUCOPHAGE) 500 MG tablet Start by taking 1 in the morning and 1 at night for a week.  Increase to 2 tabs in the morning and night thereafter. 180 tablet 3  . rosuvastatin (CRESTOR) 20 MG tablet Take 1 tablet (20 mg total) by mouth daily. 90 tablet 3  . metFORMIN (GLUCOPHAGE) 500 MG tablet START TAKING 1 TABLET IN THE MORNING AND 1 AT NIGHT FOR 1 WEEK, INCREASE TO 2 TABLETS IN THE MORNING AND NIGHT THEREAFTER 180 tablet 0   No facility-administered medications prior to visit.      No Known Allergies  ROS Review of Systems Review of Systems  Constitutional: Negative for activity change, appetite change HENT: Negative for congestion, nosebleeds, trouble swallowing and voice change.   Respiratory: see hpi, no shortness of breath and wheezing.   Gastrointestinal: Negative for diarrhea, nausea and vomiting.  Genitourinary: Negative for difficulty urinating, dysuria, flank pain and hematuria.  Musculoskeletal: Negative for back pain, joint swelling and neck pain.  Neurological: Negative for dizziness, speech difficulty, light-headedness and numbness.  See HPI. All other review of systems negative.     Objective:    BP (!) 148/94 (BP Location: Right Arm, Patient Position: Sitting, Cuff Size: Large)   Pulse 83   Temp 98.4 F (36.9 C) (Oral)   Resp 17   Ht 6\' 1"  (1.854 m)   Wt 279 lb 6.4 oz (126.7 kg)   SpO2 98%   BMI 36.86 kg/m  Wt Readings from Last 3 Encounters:  11/04/18 279 lb 6.4 oz (126.7 kg)  05/18/18 262 lb (118.8 kg)  03/19/18 262 lb (118.8 kg)    Physical Exam  General: alert, oriented, in NAD Head: normocephalic, atraumatic, no sinus tenderness Eyes: EOM intact, no scleral icterus or conjunctival injection Ears: TM clear bilaterally Nose: mucosa nonerythematous, nonedematous Throat: no pharyngeal exudate or erythema Lymph: no posterior auricular, submental or cervical lymph adenopathy Heart: normal rate, normal sinus rhythm, no murmurs Lungs: clear to auscultation bilaterally, no wheezing    Health Maintenance Due  Topic Date Due  . FOOT EXAM  11/18/1985  . OPHTHALMOLOGY EXAM  11/18/1985    There are no preventive care reminders to display for this patient.  Lab Results  Component Value Date   TSH 0.678 08/06/2017   Lab Results  Component Value Date   WBC 10.1 08/06/2017   HGB 14.7 08/06/2017   HCT 42.8 08/06/2017   MCV 85 08/06/2017   PLT 277 08/06/2017   Lab Results  Component Value Date   NA 141 02/16/2018   K  3.9 02/16/2018   CO2 22 02/16/2018   GLUCOSE 255 (H) 02/16/2018   BUN 12 02/16/2018   CREATININE 0.64 (L) 02/16/2018   BILITOT 0.2 08/06/2017   ALKPHOS 72 08/06/2017   AST 21 08/06/2017   ALT 22 08/06/2017   PROT 7.2  08/06/2017   ALBUMIN 4.2 02/16/2018   CALCIUM 9.0 02/16/2018   ANIONGAP 9 12/31/2016   Lab Results  Component Value Date   CHOL 121 05/18/2018   Lab Results  Component Value Date   HDL 46 05/18/2018   Lab Results  Component Value Date   LDLCALC 56 05/18/2018   Lab Results  Component Value Date   TRIG 93 05/18/2018   Lab Results  Component Value Date   CHOLHDL 2.6 05/18/2018   Lab Results  Component Value Date   HGBA1C 6.1 (A) 05/18/2018      Assessment & Plan:   Problem List Items Addressed This Visit      Respiratory   Acute URI    Discussed viral etiology Discussed otc decongestant Advised flonase for congestion  Increase hydration Vitamin C and zinc lozenges suggested Return to clinic if symptoms worse         Endocrine   Type 2 diabetes mellitus without complication, without long-term current use of insulin (HCC)    Pt with diabetes that is well controlled. Discussed that viral URI can lead to hyperglycemia. He should avoid meds with Dextromorphan and Phenylephrine.        Other   Smoker    Pt is cutting down on smoking and plans to quit the first of the year. Discussed his smoking. He wants to quit due to his mother's history of CAD and her history of heart disease. He is committed. Discussed chantix and nicotine gum.        Other Visit Diagnoses    Flu-like symptoms    -  Primary   Relevant Orders   POC Influenza A&B(BINAX/QUICKVUE) (Completed)      Meds ordered this encounter  Medications  . varenicline (CHANTIX STARTING MONTH PAK) 0.5 MG X 11 & 1 MG X 42 tablet    Sig: Take one 0.5 mg tablet by mouth once daily for 3 days, then increase to one 0.5 mg tablet twice daily for 4 days, then increase to one 1 mg tablet twice  daily.    Dispense:  53 tablet    Refill:  0    Follow-up: No follow-ups on file.    Doristine BosworthZoe A Stallings, MD

## 2018-11-04 ENCOUNTER — Other Ambulatory Visit: Payer: Self-pay

## 2018-11-04 ENCOUNTER — Encounter

## 2018-11-04 ENCOUNTER — Ambulatory Visit: Payer: BLUE CROSS/BLUE SHIELD | Admitting: Family Medicine

## 2018-11-04 ENCOUNTER — Encounter: Payer: Self-pay | Admitting: Family Medicine

## 2018-11-04 VITALS — BP 148/94 | HR 83 | Temp 98.4°F | Resp 17 | Ht 73.0 in | Wt 279.4 lb

## 2018-11-04 DIAGNOSIS — E119 Type 2 diabetes mellitus without complications: Secondary | ICD-10-CM | POA: Insufficient documentation

## 2018-11-04 DIAGNOSIS — F17200 Nicotine dependence, unspecified, uncomplicated: Secondary | ICD-10-CM | POA: Insufficient documentation

## 2018-11-04 DIAGNOSIS — R6889 Other general symptoms and signs: Secondary | ICD-10-CM | POA: Diagnosis not present

## 2018-11-04 DIAGNOSIS — J069 Acute upper respiratory infection, unspecified: Secondary | ICD-10-CM | POA: Diagnosis not present

## 2018-11-04 DIAGNOSIS — Z716 Tobacco abuse counseling: Secondary | ICD-10-CM

## 2018-11-04 LAB — POC INFLUENZA A&B (BINAX/QUICKVUE)
Influenza A, POC: NEGATIVE
Influenza B, POC: NEGATIVE

## 2018-11-04 MED ORDER — VARENICLINE TARTRATE 0.5 MG X 11 & 1 MG X 42 PO MISC: ORAL | 0 refills | Status: DC

## 2018-11-04 NOTE — Assessment & Plan Note (Signed)
Pt is cutting down on smoking and plans to quit the first of the year. Discussed his smoking. He wants to quit due to his mother's history of CAD and her history of heart disease. He is committed. Discussed chantix and nicotine gum.

## 2018-11-04 NOTE — Assessment & Plan Note (Signed)
Pt with diabetes that is well controlled. Discussed that viral URI can lead to hyperglycemia. He should avoid meds with Dextromorphan and Phenylephrine.

## 2018-11-04 NOTE — Assessment & Plan Note (Signed)
Discussed viral etiology Discussed otc decongestant Advised flonase for congestion  Increase hydration Vitamin C and zinc lozenges suggested Return to clinic if symptoms worse  

## 2018-11-04 NOTE — Patient Instructions (Addendum)
   If you have lab work done today you will be contacted with your lab results within the next 2 weeks.  If you have not heard from us then please contact us. The fastest way to get your results is to register for My Chart.   IF you received an x-ray today, you will receive an invoice from [facility]. Please contact [facility] at [phone] with questions or concerns regarding your invoice.   IF you received labwork today, you will receive an invoice from [facility]. Please contact [facility] at [phone] with questions or concerns regarding your invoice.   Our billing staff will not be able to assist you with questions regarding bills from these companies.  You will be contacted with the lab results as soon as they are available. The fastest way to get your results is to activate your My Chart account. Instructions are located on the last page of this paperwork. If you have not heard from us regarding the results in 2 weeks, please contact this office.     Viral Respiratory Infection A respiratory infection is an illness that affects part of the respiratory system, such as the lungs, nose, or throat. Most respiratory infections are caused by either viruses or bacteria. A respiratory infection that is caused by a virus is called a viral respiratory infection. Common types of viral respiratory infections include:  A cold.  The flu (influenza).  A respiratory syncytial virus (RSV) infection.  How do I know if I have a viral respiratory infection? Most viral respiratory infections cause:  A stuffy or runny nose.  Yellow or green nasal discharge.  A cough.  Sneezing.  Fatigue.  Achy muscles.  A sore throat.  Sweating or chills.  A fever.  A headache.  How are viral respiratory infections treated? If influenza is diagnosed early, it may be treated with an antiviral medicine that shortens the length of time a person has symptoms. Symptoms of viral  respiratory infections may be treated with over-the-counter and prescription medicines, such as:  Expectorants. These make it easier to cough up mucus.  Decongestant nasal sprays.  Health care providers do not prescribe antibiotic medicines for viral infections. This is because antibiotics are designed to kill bacteria. They have no effect on viruses. How do I know if I should stay home from work or school? To avoid exposing others to your respiratory infection, stay home if you have:  A fever.  A persistent cough.  A sore throat.  A runny nose.  Sneezing.  Muscles aches.  Headaches.  Fatigue.  Weakness.  Chills.  Sweating.  Nausea.  Follow these instructions at home:  Rest as much as possible.  Take over-the-counter and prescription medicines only as told by your health care provider.  Drink enough fluid to keep your urine clear or pale yellow. This helps prevent dehydration and helps loosen up mucus.  Gargle with a salt-water mixture 3-4 times per day or as needed. To make a salt-water mixture, completely dissolve -1 tsp of salt in 1 cup of warm water.  Use nose drops made from salt water to ease congestion and soften raw skin around your nose.  Do not drink alcohol.  Do not use tobacco products, including cigarettes, chewing tobacco, and e-cigarettes. If you need help quitting, ask your health care provider. Contact a health care provider if:  Your symptoms last for 10 days or longer.  Your symptoms get worse over time.  You have a fever.  You   have severe sinus pain in your face or forehead.  The glands in your jaw or neck become very swollen. Get help right away if:  You feel pain or pressure in your chest.  You have shortness of breath.  You faint or feel like you will faint.  You have severe and persistent vomiting.  You feel confused or disoriented. This information is not intended to replace advice given to you by your health care  provider. Make sure you discuss any questions you have with your health care provider. Document Released: 08/26/2005 Document Revised: 04/23/2016 Document Reviewed: 04/24/2015 Elsevier Interactive Patient Education  2018 [employer]  Smoking Tobacco Information Smoking tobacco will very likely harm your health. Tobacco contains a poisonous (toxic), colorless chemical called nicotine. Nicotine affects the brain and makes tobacco addictive. This change in your brain can make it hard to stop smoking. Tobacco also has other toxic chemicals that can hurt your body and raise your risk of many cancers. How can smoking tobacco affect me? Smoking tobacco can increase your chances of having serious health conditions, such as:  Cancer. Smoking is most commonly associated with lung cancer, but can lead to cancer in other parts of the body.  Chronic obstructive pulmonary disease (COPD). This is a long-term lung condition that makes it hard to breathe. It also gets worse over time.  High blood pressure (hypertension), heart disease, stroke, or heart attack.  Lung infections, such as pneumonia.  Cataracts. This is when the lenses in the eyes become clouded.  Digestive problems. This may include peptic ulcers, heartburn, and gastroesophageal reflux disease (GERD).  Oral health problems, such as gum disease and tooth loss.  Loss of taste and smell.  Smoking can affect your appearance by causing:  Wrinkles.  Yellow or stained teeth, fingers, and fingernails.  Smoking tobacco can also affect your social life.  Many workplaces, [employer], hotels, and public places are tobacco-free. This means that you may experience challenges in finding places to smoke when away from home.  The cost of a smoking habit can be expensive. Expenses for someone who smokes come in two ways: ? You spend money on a regular basis to buy tobacco. ? Your health care costs in the long-term are higher if you  smoke.  Tobacco smoke can also affect the health of those around you. Children of smokers have greater chances of: ? Sudden infant death syndrome (SIDS). ? Ear infections. ? Lung infections.  What lifestyle changes can be made?  Do not start smoking. Quit if you already do.  To quit smoking: ? Make a plan to quit smoking and commit yourself to it. Look for programs to help you and ask your health care provider for recommendations and ideas. ? Talk with your health care provider about using nicotine replacement medicines to help you quit. Medicine replacement medicines include gum, lozenges, patches, sprays, or pills. ? Do not replace cigarette smoking with electronic cigarettes, which are commonly called e-cigarettes. The safety of e-cigarettes is not known, and some may contain harmful chemicals. ? Avoid places, people, or situations that tempt you to smoke. ? If you try to quit but return to smoking, don't give up hope. It is very common for people to try a number of times before they fully succeed. When you feel ready again, give it another try.  Quitting smoking might affect the way you eat as well as your weight. Be prepared to monitor your eating habits. Get support in planning and following a  healthy diet.  Ask your health care provider about having regular tests (screenings) to check for cancer. This may include blood tests, imaging tests, and other tests.  Exercise regularly. Consider taking walks, joining a gym, or doing yoga or exercise classes.  Develop skills to manage your stress. These skills include meditation. What are the benefits of quitting smoking? By quitting smoking, you may:  Lower your risk of getting cancer and other diseases caused by smoking.  Live longer.  Breathe better.  Lower your blood pressure and heart rate.  Stop your addiction to tobacco.  Stop creating secondhand smoke that hurts other people.  Improve your sense of taste and  smell.  Look better over time, due to having fewer wrinkles and less staining.  What can happen if changes are not made? If you do not stop smoking, you may:  Get cancer and other diseases.  Develop COPD or other long-term (chronic) lung conditions.  Develop serious problems with your heart and blood vessels (cardiovascular system).  Need more tests to screen for problems caused by smoking.  Have higher, long-term healthcare costs from medicines or treatments related to smoking.  Continue to have worsening changes in your lungs, mouth, and nose.  Where to find support: To get support to quit smoking, consider:  Asking your health care provider for more information and resources.  Taking classes to learn more about quitting smoking.  Looking for local organizations that offer resources about quitting smoking.  Joining a support group for people who want to quit smoking in your local community.  Where to find more information: You may find more information about quitting smoking from:  HelpGuide.org: www.helpguide.org/articles/addictions/how-to-quit-smoking.htm  [employer]: smokefree.gov  American Lung Association: www.lung.org  Contact a health care provider if:  You have problems breathing.  Your lips, nose, or fingers turn blue.  You have chest pain.  You are coughing up blood.  You feel faint or you pass out.  You have other noticeable changes that cause you to worry. Summary  Smoking tobacco can negatively affect your health, the health of those around you, your finances, and your social life.  Do not start smoking. Quit if you already do. If you need help quitting, ask your health care provider.  Think about joining a support group for people who want to quit smoking in your local community. There are many effective programs that will help you to quit this behavior. This information is not intended to replace advice given to you by your health care  provider. Make sure you discuss any questions you have with your health care provider. Document Released: 12/01/2016 Document Revised: 12/01/2016 Document Reviewed: 12/01/2016 Elsevier Interactive Patient Education  [employer].

## 2018-11-18 ENCOUNTER — Ambulatory Visit: Payer: BLUE CROSS/BLUE SHIELD | Admitting: Physician Assistant

## 2018-11-18 ENCOUNTER — Ambulatory Visit: Payer: BLUE CROSS/BLUE SHIELD | Admitting: Emergency Medicine

## 2018-11-21 ENCOUNTER — Other Ambulatory Visit: Payer: Self-pay | Admitting: Family Medicine

## 2018-11-21 DIAGNOSIS — E119 Type 2 diabetes mellitus without complications: Secondary | ICD-10-CM

## 2018-11-22 NOTE — Telephone Encounter (Signed)
Requested Prescriptions  Pending Prescriptions Disp Refills  . metFORMIN (GLUCOPHAGE) 500 MG tablet [Pharmacy Med Name: METFORMIN 500MG TABLETS] 180 tablet 0    Sig: START TAKING 1 TABLET BY MOUTH IN THE MORNING AND 1 AT NIGHT FOR 1 WEEK, INCREASE TO 2 IN THE MORNING AND 2 AT NIGHT THEREAFTER     Endocrinology:  Diabetes - Biguanides Failed - 11/21/2018  4:03 PM      Failed - Cr in normal range and within 360 days    Creatinine, Ser  Date Value Ref Range Status  02/16/2018 0.64 (L) 0.76 - 1.27 mg/dL Final         Failed - HBA1C is between 0 and 7.9 and within 180 days    Hemoglobin A1C  Date Value Ref Range Status  05/18/2018 6.1 (A) 4.0 - 5.6 % Final   Hgb A1c MFr Bld  Date Value Ref Range Status  08/06/2017 6.8 (H) 4.8 - 5.6 % Final    Comment:             Prediabetes: 5.7 - 6.4          Diabetes: >6.4          Glycemic control for adults with diabetes: <7.0          Failed - Valid encounter within last 6 months    Recent Outpatient Visits          2 weeks ago Flu-like symptoms   [facility], Zoe A, MD   6 months ago Type 2 diabetes mellitus without complication, without long-term current use of insulin ([place])   [facility] at [facility], Audrie Lia, PA-C   8 months ago Type 2 diabetes mellitus without complication, without long-term current use of insulin ([place])   [facility] at [facility], Audrie Lia, PA-C   9 months ago At risk for acute ischemic cardiac event   [facility], Audrie Lia, PA-C   1 year ago Essential hypertension   [facility], Audrie Lia, PA-C      Future Appointments            In 3 weeks Sagardia, Ines Bloomer, MD [facility] at [place], [place] - eGFR in normal range and within 360 days    GFR calc Af [place]nia Lora  Date Value Ref Range Status  02/16/2018 140 >59 mL/min/1.73 Final   GFR calc non Af Amer  Date Value Ref Range Status  02/16/2018 121 >59 mL/min/1.73 Final

## 2018-12-14 ENCOUNTER — Other Ambulatory Visit: Payer: Self-pay

## 2018-12-14 ENCOUNTER — Encounter: Payer: Self-pay | Admitting: Emergency Medicine

## 2018-12-14 ENCOUNTER — Ambulatory Visit: Payer: BLUE CROSS/BLUE SHIELD | Admitting: Emergency Medicine

## 2018-12-14 VITALS — BP 152/100 | HR 90 | Temp 98.8°F | Resp 16 | Ht 72.25 in | Wt 275.8 lb

## 2018-12-14 DIAGNOSIS — Z9189 Other specified personal risk factors, not elsewhere classified: Secondary | ICD-10-CM

## 2018-12-14 DIAGNOSIS — I152 Hypertension secondary to endocrine disorders: Secondary | ICD-10-CM | POA: Insufficient documentation

## 2018-12-14 DIAGNOSIS — E119 Type 2 diabetes mellitus without complications: Secondary | ICD-10-CM

## 2018-12-14 DIAGNOSIS — I10 Essential (primary) hypertension: Secondary | ICD-10-CM | POA: Diagnosis not present

## 2018-12-14 LAB — POCT GLYCOSYLATED HEMOGLOBIN (HGB A1C): Hemoglobin A1C: 6.7 % — AB (ref 4.0–5.6)

## 2018-12-14 LAB — GLUCOSE, POCT (MANUAL RESULT ENTRY): POC Glucose: 115 mg/dl — AB (ref 70–99)

## 2018-12-14 MED ORDER — METFORMIN HCL 500 MG PO TABS: 1000.0000 mg | ORAL_TABLET | Freq: Two times a day (BID) | ORAL | 1 refills | Status: DC

## 2018-12-14 MED ORDER — ROSUVASTATIN CALCIUM 20 MG PO TABS: 20.0000 mg | ORAL_TABLET | Freq: Every day | ORAL | 3 refills | Status: DC

## 2018-12-14 MED ORDER — AMLODIPINE BESYLATE 10 MG PO TABS: 10.0000 mg | ORAL_TABLET | Freq: Every day | ORAL | 3 refills | Status: DC

## 2018-12-14 NOTE — Addendum Note (Signed)
Addended by: Georg Ruddle A on: 12/14/2018 03:10 PM   Modules accepted: Orders

## 2018-12-14 NOTE — Progress Notes (Signed)
Lab Results  Component Value Date   HGBA1C 6.1 (A) 05/18/2018   BP Readings from Last 3 Encounters:  12/14/18 (!) 152/100  11/04/18 (!) 148/94  05/19/18 134/85   Lab Results  Component Value Date   CHOL 121 05/18/2018   HDL 46 05/18/2018   LDLCALC 56 05/18/2018   TRIG 93 05/18/2018   CHOLHDL 2.6 05/18/2018   Kevin Roy 43 y.o.   Chief Complaint  Patient presents with  . Diabetes    x 6 months     HISTORY OF PRESENT ILLNESS: This is a 43 y.o. male with history of diabetes and hypertension here for follow-up and medication refill.  Has no complaints or medical concerns however states his diet has not been very good and expects his hemoglobin A1c to be elevated.  Blood pressures at home have been 130s over 80s.   HPI   Prior to Admission medications   Medication Sig Start Date End Date Taking? Authorizing Provider  amLODipine (NORVASC) 10 MG tablet Take 1 tablet (10 mg total) by mouth daily. 05/18/18  Yes Ofilia Neaslark, Michael L, PA-C  aspirin (SB LOW DOSE ASA EC) 81 MG EC tablet Take 1 tablet (81 mg total) by mouth daily. Swallow whole. 02/16/18  Yes Ofilia Neaslark, Michael L, PA-C  metFORMIN (GLUCOPHAGE) 500 MG tablet START TAKING 1 TABLET BY MOUTH IN THE MORNING AND 1 AT NIGHT FOR 1 WEEK, INCREASE TO 2 IN THE MORNING AND 2 AT NIGHT THEREAFTER 11/22/18  Yes Myles LippsSantiago, Irma M, MD  rosuvastatin (CRESTOR) 20 MG tablet Take 1 tablet (20 mg total) by mouth daily. 05/18/18  Yes Ofilia Neaslark, Michael L, PA-C  varenicline (CHANTIX STARTING MONTH PAK) 0.5 MG X 11 & 1 MG X 42 tablet Take one 0.5 mg tablet by mouth once daily for 3 days, then increase to one 0.5 mg tablet twice daily for 4 days, then increase to one 1 mg tablet twice daily. Patient not taking: Reported on 12/14/2018 11/04/18   Doristine BosworthStallings, Zoe A, MD    No Known Allergies  Patient Active Problem List   Diagnosis Date Noted  . Smoker 11/04/2018  . Type 2 diabetes mellitus without complication, without long-term current use of insulin (HCC)  11/04/2018  . Acute URI 11/04/2018  . Obstructive sleep apnea treated with continuous positive airway pressure (CPAP) 02/16/2018  . Snoring 09/29/2017  . Excessive daytime sleepiness 09/29/2017  . Sleep deprivation 09/29/2017  . OSA (obstructive sleep apnea) 09/29/2017    Past Medical History:  Diagnosis Date  . Hypertension   . OSA on CPAP     No past surgical history on file.  Social History   Socioeconomic History  . Marital status: Single    Spouse name: Not on file  . Number of children: Not on file  . Years of education: Not on file  . Highest education level: Not on file  Occupational History  . Not on file  Social Needs  . Financial resource strain: Not on file  . Food insecurity:    Worry: Not on file    Inability: Not on file  . Transportation needs:    Medical: Not on file    Non-medical: Not on file  Tobacco Use  . Smoking status: Current Every Day Smoker    Packs/day: 0.50    Years: 25.00    Pack years: 12.50    Types: Cigarettes  . Smokeless tobacco: Never Used  . Tobacco comment: 02/16/18 1/2 PPD  Substance and Sexual Activity  . Alcohol use:  Yes    Alcohol/week: 1.0 standard drinks    Types: 1 Cans of beer per week    Comment: OCCASSIONALY  . Drug use: No  . Sexual activity: Not on file  Lifestyle  . Physical activity:    Days per week: Not on file    Minutes per session: Not on file  . Stress: Not on file  Relationships  . Social connections:    Talks on phone: Not on file    Gets together: Not on file    Attends religious service: Not on file    Active member of club or organization: Not on file    Attends meetings of clubs or organizations: Not on file    Relationship status: Not on file  . Intimate partner violence:    Fear of current or ex partner: Not on file    Emotionally abused: Not on file    Physically abused: Not on file    Forced sexual activity: Not on file  Other Topics Concern  . Not on file  Social History Narrative   . Not on file    Family History  Problem Relation Age of Onset  . Hypertension Mother   . Hyperlipidemia Mother   . Cancer Father   . Hyperlipidemia Maternal Grandmother      Review of Systems  Constitutional: Negative.  Negative for chills, fever and weight loss.  HENT: Negative.  Negative for sore throat.   Eyes: Negative.  Negative for blurred vision and double vision.  Respiratory: Negative.  Negative for cough and shortness of breath.   Cardiovascular: Negative.  Negative for chest pain and palpitations.  Gastrointestinal: Negative.  Negative for abdominal pain, blood in stool, diarrhea, melena, nausea and vomiting.  Genitourinary: Negative.  Negative for dysuria and hematuria.  Musculoskeletal: Negative.  Negative for back pain, joint pain, myalgias and neck pain.  Skin: Negative.  Negative for rash.  Neurological: Negative.  Negative for dizziness, sensory change, focal weakness and headaches.  Endo/Heme/Allergies: Negative.  Does not bruise/bleed easily.  All other systems reviewed and are negative.   Vitals:   12/14/18 1403  BP: (!) 152/100  Pulse: 90  Resp: 16  Temp: 98.8 F (37.1 C)  SpO2: 97%    Physical Exam Vitals signs reviewed.  Constitutional:      Appearance: Normal appearance.  HENT:     Head: Normocephalic and atraumatic.     Nose: Nose normal.     Mouth/Throat:     Mouth: Mucous membranes are moist.     Pharynx: Oropharynx is clear.  Eyes:     Extraocular Movements: Extraocular movements intact.     Conjunctiva/sclera: Conjunctivae normal.     Pupils: Pupils are equal, round, and reactive to light.  Neck:     Musculoskeletal: Normal range of motion and neck supple.  Cardiovascular:     Rate and Rhythm: Normal rate and regular rhythm.     Heart sounds: Normal heart sounds.  Pulmonary:     Effort: Pulmonary effort is normal.     Breath sounds: Normal breath sounds.  Abdominal:     General: There is no distension.     Palpations:  Abdomen is soft.     Tenderness: There is no abdominal tenderness.  Musculoskeletal: Normal range of motion.  Skin:    General: Skin is warm and dry.     Capillary Refill: Capillary refill takes less than 2 seconds.  Neurological:     General: No focal deficit present.  Mental Status: He is alert and oriented to person, place, and time.  Psychiatric:        Mood and Affect: Mood normal.        Behavior: Behavior normal.    Results for orders placed or performed in visit on 12/14/18 (from the past 24 hour(s))  POCT glucose (manual entry)     Status: Abnormal   Collection Time: 12/14/18  2:33 PM  Result Value Ref Range   POC Glucose 115 (A) 70 - 99 mg/dl  POCT glycosylated hemoglobin (Hb A1C)     Status: Abnormal   Collection Time: 12/14/18  2:38 PM  Result Value Ref Range   Hemoglobin A1C 6.7 (A) 4.0 - 5.6 %   HbA1c POC (<> result, manual entry)     HbA1c, POC (prediabetic range)     HbA1c, POC (controlled diabetic range)        ASSESSMENT & PLAN: Type 2 diabetes mellitus without complication, without long-term current use of insulin (HCC) Well-controlled.  Hemoglobin A1c at 6.7.  Also taking Crestor 20 mg a day.  We will continue present medications and follow-up in 3 months.  Essential hypertension Well-controlled at home.  We will continue present medications and follow-up in 3 months.  Elba was seen today for diabetes.  Diagnoses and all orders for this visit:  Type 2 diabetes mellitus without complication, without long-term current use of insulin (HCC) -     POCT glycosylated hemoglobin (Hb A1C) -     POCT glucose (manual entry) -     HM Diabetes Foot Exam -     Comprehensive metabolic panel -     Lipid panel -     metFORMIN (GLUCOPHAGE) 500 MG tablet; Take 2 tablets (1,000 mg total) by mouth 2 (two) times daily with a meal.  Essential hypertension -     Comprehensive metabolic panel -     Lipid panel -     amLODipine (NORVASC) 10 MG tablet; Take 1 tablet  (10 mg total) by mouth daily.  At risk for acute ischemic cardiac event -     rosuvastatin (CRESTOR) 20 MG tablet; Take 1 tablet (20 mg total) by mouth daily.    Patient Instructions       If you have lab work done today you will be contacted with your lab results within the next 2 weeks.  If you have not heard from [place] then please contact us. The fastest way to get your results is to register for My Chart.   IF you received an x-ray today, you will receive an invoice from [facility]. Please contact [facility] at [phone] with questions or concerns regarding your invoice.   IF you received labwork today, you will receive an invoice from [facility]. Please contact [facility] at [phone] with questions or concerns regarding your invoice.   Our billing staff will not be able to assist you with questions regarding bills from these companies.  You will be contacted with the lab results as soon as they are available. The fastest way to get your results is to activate your My Chart account. Instructions are located on the last page of this paperwork. If you have not heard from [place] regarding the results in 2 weeks, please contact this office.     Diabetes Mellitus and Nutrition, Adult When you have diabetes (diabetes mellitus), it is very important to have healthy eating habits because your blood sugar (glucose) levels are greatly affected by what you eat and drink.  Eating healthy foods in the appropriate amounts, at about the same times every day, can help you:  Control your blood glucose.  Lower your risk of heart disease.  Improve your blood pressure.  Reach or maintain a healthy weight. Every person with diabetes is different, and each person has different needs for a meal plan. Your health care provider may recommend that you work with a diet and nutrition specialist (dietitian) to make a meal plan that is best for you. Your meal plan may vary depending on  factors such as:  The calories you need.  The medicines you take.  Your weight.  Your blood glucose, blood pressure, and cholesterol levels.  Your activity level.  Other health conditions you have, such as heart or kidney disease. How do carbohydrates affect me? Carbohydrates, also called carbs, affect your blood glucose level more than any other type of food. Eating carbs naturally raises the amount of glucose in your blood. Carb counting is a method for keeping track of how many carbs you eat. Counting carbs is important to keep your blood glucose at a healthy level, especially if you use insulin or take certain oral diabetes medicines. It is important to know how many carbs you can safely have in each meal. This is different for every person. Your dietitian can help you calculate how many carbs you should have at each meal and for each snack. Foods that contain carbs include:  Bread, cereal, rice, pasta, and crackers.  Potatoes and corn.  Peas, beans, and lentils.  Milk and yogurt.  Fruit and juice.  Desserts, such as cakes, cookies, ice cream, and candy. How does alcohol affect me? Alcohol can cause a sudden decrease in blood glucose (hypoglycemia), especially if you use insulin or take certain oral diabetes medicines. Hypoglycemia can be a life-threatening condition. Symptoms of hypoglycemia (sleepiness, dizziness, and confusion) are similar to symptoms of having too much alcohol. If your health care provider says that alcohol is safe for you, follow these guidelines:  Limit alcohol intake to no more than 1 drink per day for nonpregnant women and 2 drinks per day for men. One drink equals 12 oz of beer, 5 oz of wine, or 1 oz of hard liquor.  Do not drink on an empty stomach.  Keep yourself hydrated with water, diet soda, or unsweetened iced tea.  Keep in mind that regular soda, juice, and other mixers may contain a lot of sugar and must be counted as carbs. What are tips  for following this plan?  Reading food labels  Start by checking the serving size on the "Nutrition Facts" label of packaged foods and drinks. The amount of calories, carbs, fats, and other nutrients listed on the label is based on one serving of the item. Many items contain more than one serving per package.  Check the total grams (g) of carbs in one serving. You can calculate the number of servings of carbs in one serving by dividing the total carbs by 15. For example, if a food has 30 g of total carbs, it would be equal to 2 servings of carbs.  Check the number of grams (g) of saturated and trans fats in one serving. Choose foods that have low or no amount of these fats.  Check the number of milligrams (mg) of salt (sodium) in one serving. Most people should limit total sodium intake to less than 2,300 mg per day.  Always check the nutrition information of foods labeled as "low-fat" or "  nonfat". These foods may be higher in added sugar or refined carbs and should be avoided.  Talk to your dietitian to identify your daily goals for nutrients listed on the label. Shopping  Avoid buying canned, premade, or processed foods. These foods tend to be high in fat, sodium, and added sugar.  Shop around the outside edge of the grocery store. This includes fresh fruits and vegetables, bulk grains, fresh meats, and fresh dairy. Cooking  Use low-heat cooking methods, such as baking, instead of high-heat cooking methods like deep frying.  Cook using healthy oils, such as olive, canola, or sunflower oil.  Avoid cooking with butter, cream, or high-fat meats. Meal planning  Eat meals and snacks regularly, preferably at the same times every day. Avoid going long periods of time without eating.  Eat foods high in fiber, such as fresh fruits, vegetables, beans, and whole grains. Talk to your dietitian about how many servings of carbs you can eat at each meal.  Eat 4-6 ounces (oz) of lean protein each  day, such as lean meat, chicken, fish, eggs, or tofu. One oz of lean protein is equal to: ? 1 oz of meat, chicken, or fish. ? 1 egg. ?  cup of tofu.  Eat some foods each day that contain healthy fats, such as avocado, nuts, seeds, and fish. Lifestyle  Check your blood glucose regularly.  Exercise regularly as told by your health care provider. This may include: ? 150 minutes of moderate-intensity or vigorous-intensity exercise each week. This could be brisk walking, biking, or water aerobics. ? Stretching and doing strength exercises, such as yoga or weightlifting, at least 2 times a week.  Take medicines as told by your health care provider.  Do not use any products that contain nicotine or tobacco, such as cigarettes and e-cigarettes. If you need help quitting, ask your health care provider.  Work with a Veterinary surgeon or diabetes educator to identify strategies to manage stress and any emotional and social challenges. Questions to ask a health care provider  Do I need to meet with a diabetes educator?  Do I need to meet with a dietitian?  What number can I call if I have questions?  When are the best times to check my blood glucose? Where to find more information:  American Diabetes Association: diabetes.org  Academy of Nutrition and Dietetics: www.eatright.[employer] of Diabetes and Digestive and Kidney Diseases (NIH): CarFlippers.tn Summary  A healthy meal plan will help you control your blood glucose and maintain a healthy lifestyle.  Working with a diet and nutrition specialist (dietitian) can help you make a meal plan that is best for you.  Keep in mind that carbohydrates (carbs) and alcohol have immediate effects on your blood glucose levels. It is important to count carbs and to use alcohol carefully. This information is not intended to replace advice given to you by your health care provider. Make sure you discuss any questions you have with your  health care provider. Document Released: 08/13/2005 Document Revised: 06/16/2017 Document Reviewed: 12/21/2016 Elsevier Interactive Patient Education  2019 [employer].  Hypertension Hypertension, commonly called high blood pressure, is when the force of blood pumping through the arteries is too strong. The arteries are the blood vessels that carry blood from the heart throughout the body. Hypertension forces the heart to work harder to pump blood and may cause arteries to become narrow or stiff. Having untreated or uncontrolled hypertension can cause heart attacks, strokes, kidney disease, and other  problems. A blood pressure reading consists of a higher number over a lower number. Ideally, your blood pressure should be below 120/80. The first ("top") number is called the systolic pressure. It is a measure of the pressure in your arteries as your heart beats. The second ("bottom") number is called the diastolic pressure. It is a measure of the pressure in your arteries as the heart relaxes. What are the causes? The cause of this condition is not known. What increases the risk? Some risk factors for high blood pressure are under your control. Others are not. Factors you can change  Smoking.  Having type 2 diabetes mellitus, high cholesterol, or both.  Not getting enough exercise or physical activity.  Being overweight.  Having too much fat, sugar, calories, or salt (sodium) in your diet.  Drinking too much alcohol. Factors that are difficult or impossible to change  Having chronic kidney disease.  Having a family history of high blood pressure.  Age. Risk increases with age.  Race. You may be at higher risk if you are African-American.  Gender. Men are at higher risk than women before age 62. After age 63, women are at higher risk than men.  Having obstructive sleep apnea.  Stress. What are the signs or symptoms? Extremely high blood pressure (hypertensive crisis) may  cause:  Headache.  Anxiety.  Shortness of breath.  Nosebleed.  Nausea and vomiting.  Severe chest pain.  Jerky movements you cannot control (seizures). How is this diagnosed? This condition is diagnosed by measuring your blood pressure while you are seated, with your arm resting on a surface. The cuff of the blood pressure monitor will be placed directly against the skin of your upper arm at the level of your heart. It should be measured at least twice using the same arm. Certain conditions can cause a difference in blood pressure between your right and left arms. Certain factors can cause blood pressure readings to be lower or higher than normal (elevated) for a short period of time:  When your blood pressure is higher when you are in a health care provider's office than when you are at home, this is called white coat hypertension. Most people with this condition do not need medicines.  When your blood pressure is higher at home than when you are in a health care provider's office, this is called masked hypertension. Most people with this condition may need medicines to control blood pressure. If you have a high blood pressure reading during one visit or you have normal blood pressure with other risk factors:  You may be asked to return on a different day to have your blood pressure checked again.  You may be asked to monitor your blood pressure at home for 1 week or longer. If you are diagnosed with hypertension, you may have other blood or imaging tests to help your health care provider understand your overall risk for other conditions. How is this treated? This condition is treated by making healthy lifestyle changes, such as eating healthy foods, exercising more, and reducing your alcohol intake. Your health care provider may prescribe medicine if lifestyle changes are not enough to get your blood pressure under control, and if:  Your systolic blood pressure is above 130.  Your  diastolic blood pressure is above 80. Your personal target blood pressure may vary depending on your medical conditions, your age, and other factors. Follow these instructions at home: Eating and drinking   Eat a diet that is high  in fiber and potassium, and low in sodium, added sugar, and fat. An example eating plan is called the DASH (Dietary Approaches to Stop Hypertension) diet. To eat this way: ? Eat plenty of fresh fruits and vegetables. Try to fill half of your plate at each meal with fruits and vegetables. ? Eat whole grains, such as whole wheat pasta, brown rice, or whole grain bread. Fill about one quarter of your plate with whole grains. ? Eat or drink low-fat dairy products, such as skim milk or low-fat yogurt. ? Avoid fatty cuts of meat, processed or cured meats, and poultry with skin. Fill about one quarter of your plate with lean proteins, such as fish, chicken without skin, beans, eggs, and tofu. ? Avoid premade and processed foods. These tend to be higher in sodium, added sugar, and fat.  Reduce your daily sodium intake. Most people with hypertension should eat less than 1,500 mg of sodium a day.  Limit alcohol intake to no more than 1 drink a day for nonpregnant women and 2 drinks a day for men. One drink equals 12 oz of beer, 5 oz of wine, or 1 oz of hard liquor. Lifestyle   Work with your health care provider to maintain a healthy body weight or to lose weight. Ask what an ideal weight is for you.  Get at least 30 minutes of exercise that causes your heart to beat faster (aerobic exercise) most days of the week. Activities may include walking, swimming, or biking.  Include exercise to strengthen your muscles (resistance exercise), such as pilates or lifting weights, as part of your weekly exercise routine. Try to do these types of exercises for 30 minutes at least 3 days a week.  Do not use any products that contain nicotine or tobacco, such as cigarettes and  e-cigarettes. If you need help quitting, ask your health care provider.  Monitor your blood pressure at home as told by your health care provider.  Keep all follow-up visits as told by your health care provider. This is important. Medicines  Take over-the-counter and prescription medicines only as told by your health care provider. Follow directions carefully. Blood pressure medicines must be taken as prescribed.  Do not skip doses of blood pressure medicine. Doing this puts you at risk for problems and can make the medicine less effective.  Ask your health care provider about side effects or reactions to medicines that you should watch for. Contact a health care provider if:  You think you are having a reaction to a medicine you are taking.  You have headaches that keep coming back (recurring).  You feel dizzy.  You have swelling in your ankles.  You have trouble with your vision. Get help right away if:  You develop a severe headache or confusion.  You have unusual weakness or numbness.  You feel faint.  You have severe pain in your chest or abdomen.  You vomit repeatedly.  You have trouble breathing. Summary  Hypertension is when the force of blood pumping through your arteries is too strong. If this condition is not controlled, it may put you at risk for serious complications.  Your personal target blood pressure may vary depending on your medical conditions, your age, and other factors. For most people, a normal blood pressure is less than 120/80.  Hypertension is treated with lifestyle changes, medicines, or a combination of both. Lifestyle changes include weight loss, eating a healthy, low-sodium diet, exercising more, and limiting alcohol. This information is  not intended to replace advice given to you by your health care provider. Make sure you discuss any questions you have with your health care provider. Document Released: 11/16/2005 Document Revised: 10/14/2016  Document Reviewed: 10/14/2016 Elsevier Interactive Patient Education  2019 Elsevier Inc.      Edwina Barth, MD [facility]

## 2018-12-14 NOTE — Patient Instructions (Addendum)
   If you have lab work done today you will be contacted with your lab results within the next 2 weeks.  If you have not heard from us then please contact us. The fastest way to get your results is to register for My Chart.   IF you received an x-ray today, you will receive an invoice from [facility]. Please contact [facility] at [phone] with questions or concerns regarding your invoice.   IF you received labwork today, you will receive an invoice from [facility]. Please contact [facility] at [phone] with questions or concerns regarding your invoice.   Our billing staff will not be able to assist you with questions regarding bills from these companies.  You will be contacted with the lab results as soon as they are available. The fastest way to get your results is to activate your My Chart account. Instructions are located on the last page of this paperwork. If you have not heard from us regarding the results in 2 weeks, please contact this office.     Diabetes Mellitus and Nutrition, Adult When you have diabetes (diabetes mellitus), it is very important to have healthy eating habits because your blood sugar (glucose) levels are greatly affected by what you eat and drink. Eating healthy foods in the appropriate amounts, at about the same times every day, can help you:  Control your blood glucose.  Lower your risk of heart disease.  Improve your blood pressure.  Reach or maintain a healthy weight. Every person with diabetes is different, and each person has different needs for a meal plan. Your health care provider may recommend that you work with a diet and nutrition specialist (dietitian) to make a meal plan that is best for you. Your meal plan may vary depending on factors such as:  The calories you need.  The medicines you take.  Your weight.  Your blood glucose, blood pressure, and cholesterol levels.  Your activity level.  Other health conditions  you have, such as heart or kidney disease. How do carbohydrates affect me? Carbohydrates, also called carbs, affect your blood glucose level more than any other type of food. Eating carbs naturally raises the amount of glucose in your blood. Carb counting is a method for keeping track of how many carbs you eat. Counting carbs is important to keep your blood glucose at a healthy level, especially if you use insulin or take certain oral diabetes medicines. It is important to know how many carbs you can safely have in each meal. This is different for every person. Your dietitian can help you calculate how many carbs you should have at each meal and for each snack. Foods that contain carbs include:  Bread, cereal, rice, pasta, and crackers.  Potatoes and corn.  Peas, beans, and lentils.  Milk and yogurt.  Fruit and juice.  Desserts, such as cakes, cookies, ice cream, and candy. How does alcohol affect me? Alcohol can cause a sudden decrease in blood glucose (hypoglycemia), especially if you use insulin or take certain oral diabetes medicines. Hypoglycemia can be a life-threatening condition. Symptoms of hypoglycemia (sleepiness, dizziness, and confusion) are similar to symptoms of having too much alcohol. If your health care provider says that alcohol is safe for you, follow these guidelines:  Limit alcohol intake to no more than 1 drink per day for nonpregnant women and 2 drinks per day for men. One drink equals 12 oz of beer, 5 oz of wine, or 1 oz of hard liquor.    Do not drink on an empty stomach.  Keep yourself hydrated with water, diet soda, or unsweetened iced tea.  Keep in mind that regular soda, juice, and other mixers may contain a lot of sugar and must be counted as carbs. What are tips for following this plan?  Reading food labels  Start by checking the serving size on the "Nutrition Facts" label of packaged foods and drinks. The amount of calories, carbs, fats, and other  nutrients listed on the label is based on one serving of the item. Many items contain more than one serving per package.  Check the total grams (g) of carbs in one serving. You can calculate the number of servings of carbs in one serving by dividing the total carbs by 15. For example, if a food has 30 g of total carbs, it would be equal to 2 servings of carbs.  Check the number of grams (g) of saturated and trans fats in one serving. Choose foods that have low or no amount of these fats.  Check the number of milligrams (mg) of salt (sodium) in one serving. Most people should limit total sodium intake to less than 2,300 mg per day.  Always check the nutrition information of foods labeled as "low-fat" or "nonfat". These foods may be higher in added sugar or refined carbs and should be avoided.  Talk to your dietitian to identify your daily goals for nutrients listed on the label. Shopping  Avoid buying canned, premade, or processed foods. These foods tend to be high in fat, sodium, and added sugar.  Shop around the outside edge of the grocery store. This includes fresh fruits and vegetables, bulk grains, fresh meats, and fresh dairy. Cooking  Use low-heat cooking methods, such as baking, instead of high-heat cooking methods like deep frying.  Cook using healthy oils, such as olive, canola, or sunflower oil.  Avoid cooking with butter, cream, or high-fat meats. Meal planning  Eat meals and snacks regularly, preferably at the same times every day. Avoid going long periods of time without eating.  Eat foods high in fiber, such as fresh fruits, vegetables, beans, and whole grains. Talk to your dietitian about how many servings of carbs you can eat at each meal.  Eat 4-6 ounces (oz) of lean protein each day, such as lean meat, chicken, fish, eggs, or tofu. One oz of lean protein is equal to: ? 1 oz of meat, chicken, or fish. ? 1 egg. ?  cup of tofu.  Eat some foods each day that contain  healthy fats, such as avocado, nuts, seeds, and fish. Lifestyle  Check your blood glucose regularly.  Exercise regularly as told by your health care provider. This may include: ? 150 minutes of moderate-intensity or vigorous-intensity exercise each week. This could be brisk walking, biking, or water aerobics. ? Stretching and doing strength exercises, such as yoga or weightlifting, at least 2 times a week.  Take medicines as told by your health care provider.  Do not use any products that contain nicotine or tobacco, such as cigarettes and e-cigarettes. If you need help quitting, ask your health care provider.  Work with a counselor or diabetes educator to identify strategies to manage stress and any emotional and social challenges. Questions to ask a health care provider  Do I need to meet with a diabetes educator?  Do I need to meet with a dietitian?  What number can I call if I have questions?  When are the best times to   check my blood glucose? Where to find more information:  American Diabetes Association: diabetes.org  Academy of Nutrition and Dietetics: www.eatright.org  National Institute of Diabetes and Digestive and Kidney Diseases (NIH): www.niddk.nih.gov Summary  A healthy meal plan will help you control your blood glucose and maintain a healthy lifestyle.  Working with a diet and nutrition specialist (dietitian) can help you make a meal plan that is best for you.  Keep in mind that carbohydrates (carbs) and alcohol have immediate effects on your blood glucose levels. It is important to count carbs and to use alcohol carefully. This information is not intended to replace advice given to you by your health care provider. Make sure you discuss any questions you have with your health care provider. Document Released: 08/13/2005 Document Revised: 06/16/2017 Document Reviewed: 12/21/2016 Elsevier Interactive Patient Education  2019 [employer]  Hypertension Hypertension, commonly called high blood pressure, is when the force of blood pumping through the arteries is too strong. The arteries are the blood vessels that carry blood from the heart throughout the body. Hypertension forces the heart to work harder to pump blood and may cause arteries to become narrow or stiff. Having untreated or uncontrolled hypertension can cause heart attacks, strokes, kidney disease, and other problems. A blood pressure reading consists of a higher number over a lower number. Ideally, your blood pressure should be below 120/80. The first ("top") number is called the systolic pressure. It is a measure of the pressure in your arteries as your heart beats. The second ("bottom") number is called the diastolic pressure. It is a measure of the pressure in your arteries as the heart relaxes. What are the causes? The cause of this condition is not known. What increases the risk? Some risk factors for high blood pressure are under your control. Others are not. Factors you can change  Smoking.  Having type 2 diabetes mellitus, high cholesterol, or both.  Not getting enough exercise or physical activity.  Being overweight.  Having too much fat, sugar, calories, or salt (sodium) in your diet.  Drinking too much alcohol. Factors that are difficult or impossible to change  Having chronic kidney disease.  Having a family history of high blood pressure.  Age. Risk increases with age.  Race. You may be at higher risk if you are African-American.  Gender. Men are at higher risk than women before age 45. After age 65, women are at higher risk than men.  Having obstructive sleep apnea.  Stress. What are the signs or symptoms? Extremely high blood pressure (hypertensive crisis) may cause:  Headache.  Anxiety.  Shortness of breath.  Nosebleed.  Nausea and vomiting.  Severe chest pain.  Jerky movements you cannot control (seizures). How is  this diagnosed? This condition is diagnosed by measuring your blood pressure while you are seated, with your arm resting on a surface. The cuff of the blood pressure monitor will be placed directly against the skin of your upper arm at the level of your heart. It should be measured at least twice using the same arm. Certain conditions can cause a difference in blood pressure between your right and left arms. Certain factors can cause blood pressure readings to be lower or higher than normal (elevated) for a short period of time:  When your blood pressure is higher when you are in a health care provider's office than when you are at home, this is called white coat hypertension. Most people with this condition do not need medicines.    When your blood pressure is higher at home than when you are in a health care provider's office, this is called masked hypertension. Most people with this condition may need medicines to control blood pressure. If you have a high blood pressure reading during one visit or you have normal blood pressure with other risk factors:  You may be asked to return on a different day to have your blood pressure checked again.  You may be asked to monitor your blood pressure at home for 1 week or longer. If you are diagnosed with hypertension, you may have other blood or imaging tests to help your health care provider understand your overall risk for other conditions. How is this treated? This condition is treated by making healthy lifestyle changes, such as eating healthy foods, exercising more, and reducing your alcohol intake. Your health care provider may prescribe medicine if lifestyle changes are not enough to get your blood pressure under control, and if:  Your systolic blood pressure is above 130.  Your diastolic blood pressure is above 80. Your personal target blood pressure may vary depending on your medical conditions, your age, and other factors. Follow these  instructions at home: Eating and drinking   Eat a diet that is high in fiber and potassium, and low in sodium, added sugar, and fat. An example eating plan is called the DASH (Dietary Approaches to Stop Hypertension) diet. To eat this way: ? Eat plenty of fresh fruits and vegetables. Try to fill half of your plate at each meal with fruits and vegetables. ? Eat whole grains, such as whole wheat pasta, brown rice, or whole grain bread. Fill about one quarter of your plate with whole grains. ? Eat or drink low-fat dairy products, such as skim milk or low-fat yogurt. ? Avoid fatty cuts of meat, processed or cured meats, and poultry with skin. Fill about one quarter of your plate with lean proteins, such as fish, chicken without skin, beans, eggs, and tofu. ? Avoid premade and processed foods. These tend to be higher in sodium, added sugar, and fat.  Reduce your daily sodium intake. Most people with hypertension should eat less than 1,500 mg of sodium a day.  Limit alcohol intake to no more than 1 drink a day for nonpregnant women and 2 drinks a day for men. One drink equals 12 oz of beer, 5 oz of wine, or 1 oz of hard liquor. Lifestyle   Work with your health care provider to maintain a healthy body weight or to lose weight. Ask what an ideal weight is for you.  Get at least 30 minutes of exercise that causes your heart to beat faster (aerobic exercise) most days of the week. Activities may include walking, swimming, or biking.  Include exercise to strengthen your muscles (resistance exercise), such as pilates or lifting weights, as part of your weekly exercise routine. Try to do these types of exercises for 30 minutes at least 3 days a week.  Do not use any products that contain nicotine or tobacco, such as cigarettes and e-cigarettes. If you need help quitting, ask your health care provider.  Monitor your blood pressure at home as told by your health care provider.  Keep all follow-up  visits as told by your health care provider. This is important. Medicines  Take over-the-counter and prescription medicines only as told by your health care provider. Follow directions carefully. Blood pressure medicines must be taken as prescribed.  Do not skip doses of blood pressure   medicine. Doing this puts you at risk for problems and can make the medicine less effective.  Ask your health care provider about side effects or reactions to medicines that you should watch for. Contact a health care provider if:  You think you are having a reaction to a medicine you are taking.  You have headaches that keep coming back (recurring).  You feel dizzy.  You have swelling in your ankles.  You have trouble with your vision. Get help right away if:  You develop a severe headache or confusion.  You have unusual weakness or numbness.  You feel faint.  You have severe pain in your chest or abdomen.  You vomit repeatedly.  You have trouble breathing. Summary  Hypertension is when the force of blood pumping through your arteries is too strong. If this condition is not controlled, it may put you at risk for serious complications.  Your personal target blood pressure may vary depending on your medical conditions, your age, and other factors. For most people, a normal blood pressure is less than 120/80.  Hypertension is treated with lifestyle changes, medicines, or a combination of both. Lifestyle changes include weight loss, eating a healthy, low-sodium diet, exercising more, and limiting alcohol. This information is not intended to replace advice given to you by your health care provider. Make sure you discuss any questions you have with your health care provider. Document Released: 11/16/2005 Document Revised: 10/14/2016 Document Reviewed: 10/14/2016 Elsevier Interactive Patient Education  2019 [employer] Inc.  

## 2018-12-14 NOTE — Assessment & Plan Note (Addendum)
Well-controlled.  Hemoglobin A1c at 6.7.  Also taking Crestor 20 mg a day.  We will continue present medications and follow-up in 3 months.

## 2018-12-14 NOTE — Assessment & Plan Note (Signed)
Well-controlled at home.  We will continue present medications and follow-up in 3 months.

## 2018-12-15 LAB — LIPID PANEL
Chol/HDL Ratio: 2.9 ratio (ref 0.0–5.0)
Cholesterol, Total: 120 mg/dL (ref 100–199)
HDL: 41 mg/dL (ref >39)
LDL Calculated: 58 mg/dL (ref 0–99)
TRIGLYCERIDES: 104 mg/dL (ref 0–149)
VLDL Cholesterol Cal: 21 mg/dL (ref 5–40)

## 2018-12-15 LAB — COMPREHENSIVE METABOLIC PANEL
ALT: 27 IU/L (ref 0–44)
AST: 23 IU/L (ref 0–40)
Albumin/Globulin Ratio: 1.8 (ref 1.2–2.2)
Albumin: 4.6 g/dL (ref 3.5–5.5)
Alkaline Phosphatase: 70 IU/L (ref 39–117)
BUN/Creatinine Ratio: 16 (ref 9–20)
BUN: 10 mg/dL (ref 6–24)
Bilirubin Total: 0.2 mg/dL (ref 0.0–1.2)
CO2: 21 mmol/L (ref 20–29)
Calcium: 9.1 mg/dL (ref 8.7–10.2)
Chloride: 103 mmol/L (ref 96–106)
Creatinine, Ser: 0.63 mg/dL — ABNORMAL LOW (ref 0.76–1.27)
GFR calc Af Amer: 140 mL/min/{1.73_m2} (ref >59)
GFR calc non Af Amer: 121 mL/min/{1.73_m2} (ref >59)
Globulin, Total: 2.6 g/dL (ref 1.5–4.5)
Glucose: 104 mg/dL — ABNORMAL HIGH (ref 65–99)
Potassium: 3.7 mmol/L (ref 3.5–5.2)
Sodium: 140 mmol/L (ref 134–144)
Total Protein: 7.2 g/dL (ref 6.0–8.5)

## 2018-12-19 ENCOUNTER — Telehealth: Payer: Self-pay

## 2018-12-19 NOTE — Telephone Encounter (Signed)
Lab letter sent via mail to home address. Dgaddy, CMA

## 2019-01-19 ENCOUNTER — Telehealth: Payer: Self-pay | Admitting: Emergency Medicine

## 2019-01-19 NOTE — Telephone Encounter (Signed)
Called and spoke with pt regarding their appt scheduled with Dr. Alvy Bimler on 4/10. I was able to get the pt rescheduled to 4/8 with Dr. Alvy Bimler. I advised of time and late policy. Pt acknowledged.

## 2019-03-08 ENCOUNTER — Ambulatory Visit: Payer: BLUE CROSS/BLUE SHIELD | Admitting: Emergency Medicine

## 2019-03-10 ENCOUNTER — Ambulatory Visit: Payer: BLUE CROSS/BLUE SHIELD | Admitting: Emergency Medicine

## 2019-03-16 ENCOUNTER — Ambulatory Visit: Payer: BLUE CROSS/BLUE SHIELD | Admitting: Emergency Medicine

## 2019-05-01 ENCOUNTER — Ambulatory Visit: Payer: BLUE CROSS/BLUE SHIELD | Admitting: Emergency Medicine

## 2019-05-19 ENCOUNTER — Other Ambulatory Visit: Payer: Self-pay | Admitting: Physician Assistant

## 2019-05-19 DIAGNOSIS — Z9189 Other specified personal risk factors, not elsewhere classified: Secondary | ICD-10-CM

## 2019-05-30 ENCOUNTER — Other Ambulatory Visit: Payer: Self-pay

## 2019-05-30 ENCOUNTER — Ambulatory Visit: Payer: BLUE CROSS/BLUE SHIELD | Admitting: Emergency Medicine

## 2019-05-30 ENCOUNTER — Encounter: Payer: Self-pay | Admitting: Emergency Medicine

## 2019-05-30 VITALS — BP 142/91 | HR 90 | Temp 99.1°F | Resp 16 | Ht 74.5 in | Wt 283.4 lb

## 2019-05-30 DIAGNOSIS — E119 Type 2 diabetes mellitus without complications: Secondary | ICD-10-CM

## 2019-05-30 DIAGNOSIS — Z23 Encounter for immunization: Secondary | ICD-10-CM | POA: Diagnosis not present

## 2019-05-30 DIAGNOSIS — I10 Essential (primary) hypertension: Secondary | ICD-10-CM | POA: Diagnosis not present

## 2019-05-30 NOTE — Patient Instructions (Addendum)
   If you have lab work done today you will be contacted with your lab results within the next 2 weeks.  If you have not heard from us then please contact us. The fastest way to get your results is to register for My Chart.   IF you received an x-ray today, you will receive an invoice from [facility]. Please contact [facility] at [phone] with questions or concerns regarding your invoice.   IF you received labwork today, you will receive an invoice from [facility]. Please contact [facility] at [phone] with questions or concerns regarding your invoice.   Our billing staff will not be able to assist you with questions regarding bills from these companies.  You will be contacted with the lab results as soon as they are available. The fastest way to get your results is to activate your My Chart account. Instructions are located on the last page of this paperwork. If you have not heard from us regarding the results in 2 weeks, please contact this office.     Hypertension, Adult High blood pressure (hypertension) is when the force of blood pumping through the arteries is too strong. The arteries are the blood vessels that carry blood from the heart throughout the body. Hypertension forces the heart to work harder to pump blood and may cause arteries to become narrow or stiff. Untreated or uncontrolled hypertension can cause a heart attack, heart failure, a stroke, kidney disease, and other problems. A blood pressure reading consists of a higher number over a lower number. Ideally, your blood pressure should be below 120/80. The first ("top") number is called the systolic pressure. It is a measure of the pressure in your arteries as your heart beats. The second ("bottom") number is called the diastolic pressure. It is a measure of the pressure in your arteries as the heart relaxes. What are the causes? The exact cause of this condition is not known. There are some conditions  that result in or are related to high blood pressure. What increases the risk? Some risk factors for high blood pressure are under your control. The following factors may make you more likely to develop this condition:  Smoking.  Having type 2 diabetes mellitus, high cholesterol, or both.  Not getting enough exercise or physical activity.  Being overweight.  Having too much fat, sugar, calories, or salt (sodium) in your diet.  Drinking too much alcohol. Some risk factors for high blood pressure may be difficult or impossible to change. Some of these factors include:  Having chronic kidney disease.  Having a family history of high blood pressure.  Age. Risk increases with age.  Race. You may be at higher risk if you are African American.  Gender. Men are at higher risk than women before age 45. After age 65, women are at higher risk than men.  Having obstructive sleep apnea.  Stress. What are the signs or symptoms? High blood pressure may not cause symptoms. Very high blood pressure (hypertensive crisis) may cause:  Headache.  Anxiety.  Shortness of breath.  Nosebleed.  Nausea and vomiting.  Vision changes.  Severe chest pain.  Seizures. How is this diagnosed? This condition is diagnosed by measuring your blood pressure while you are seated, with your arm resting on a flat surface, your legs uncrossed, and your feet flat on the floor. The cuff of the blood pressure monitor will be placed directly against the skin of your upper arm at the level of your heart.   It should be measured at least twice using the same arm. Certain conditions can cause a difference in blood pressure between your right and left arms. Certain factors can cause blood pressure readings to be lower or higher than normal for a short period of time:  When your blood pressure is higher when you are in a health care provider's office than when you are at home, this is called white coat hypertension.  Most people with this condition do not need medicines.  When your blood pressure is higher at home than when you are in a health care provider's office, this is called masked hypertension. Most people with this condition may need medicines to control blood pressure. If you have a high blood pressure reading during one visit or you have normal blood pressure with other risk factors, you may be asked to:  Return on a different day to have your blood pressure checked again.  Monitor your blood pressure at home for 1 week or longer. If you are diagnosed with hypertension, you may have other blood or imaging tests to help your health care provider understand your overall risk for other conditions. How is this treated? This condition is treated by making healthy lifestyle changes, such as eating healthy foods, exercising more, and reducing your alcohol intake. Your health care provider may prescribe medicine if lifestyle changes are not enough to get your blood pressure under control, and if:  Your systolic blood pressure is above 130.  Your diastolic blood pressure is above 80. Your personal target blood pressure may vary depending on your medical conditions, your age, and other factors. Follow these instructions at home: Eating and drinking   Eat a diet that is high in fiber and potassium, and low in sodium, added sugar, and fat. An example eating plan is called the DASH (Dietary Approaches to Stop Hypertension) diet. To eat this way: ? Eat plenty of fresh fruits and vegetables. Try to fill one half of your plate at each meal with fruits and vegetables. ? Eat whole grains, such as whole-wheat pasta, brown rice, or whole-grain bread. Fill about one fourth of your plate with whole grains. ? Eat or drink low-fat dairy products, such as skim milk or low-fat yogurt. ? Avoid fatty cuts of meat, processed or cured meats, and poultry with skin. Fill about one fourth of your plate with lean proteins, such  as fish, chicken without skin, beans, eggs, or tofu. ? Avoid pre-made and processed foods. These tend to be higher in sodium, added sugar, and fat.  Reduce your daily sodium intake. Most people with hypertension should eat less than 1,500 mg of sodium a day.  Do not drink alcohol if: ? Your health care provider tells you not to drink. ? You are pregnant, may be pregnant, or are planning to become pregnant.  If you drink alcohol: ? Limit how much you use to:  0-1 drink a day for women.  0-2 drinks a day for men. ? Be aware of how much alcohol is in your drink. In the [place] one drink equals one 12 oz bottle of beer (355 mL), one 5 oz glass of wine (148 mL), or one 1 oz glass of hard liquor (44 mL). Lifestyle   Work with your health care provider to maintain a healthy body weight or to lose weight. Ask what an ideal weight is for you.  Get at least 30 minutes of exercise most days of the week. Activities may include walking, swimming, or   biking.  Include exercise to strengthen your muscles (resistance exercise), such as Pilates or lifting weights, as part of your weekly exercise routine. Try to do these types of exercises for 30 minutes at least 3 days a week.  Do not use any products that contain nicotine or tobacco, such as cigarettes, e-cigarettes, and chewing tobacco. If you need help quitting, ask your health care provider.  Monitor your blood pressure at home as told by your health care provider.  Keep all follow-up visits as told by your health care provider. This is important. Medicines  Take over-the-counter and prescription medicines only as told by your health care provider. Follow directions carefully. Blood pressure medicines must be taken as prescribed.  Do not skip doses of blood pressure medicine. Doing this puts you at risk for problems and can make the medicine less effective.  Ask your health care provider about side effects or reactions to medicines that you  should watch for. Contact a health care provider if you:  Think you are having a reaction to a medicine you are taking.  Have headaches that keep coming back (recurring).  Feel dizzy.  Have swelling in your ankles.  Have trouble with your vision. Get help right away if you:  Develop a severe headache or confusion.  Have unusual weakness or numbness.  Feel faint.  Have severe pain in your chest or abdomen.  Vomit repeatedly.  Have trouble breathing. Summary  Hypertension is when the force of blood pumping through your arteries is too strong. If this condition is not controlled, it may put you at risk for serious complications.  Your personal target blood pressure may vary depending on your medical conditions, your age, and other factors. For most people, a normal blood pressure is less than 120/80.  Hypertension is treated with lifestyle changes, medicines, or a combination of both. Lifestyle changes include losing weight, eating a healthy, low-sodium diet, exercising more, and limiting alcohol. This information is not intended to replace advice given to you by your health care provider. Make sure you discuss any questions you have with your health care provider. Document Released: 11/16/2005 Document Revised: 07/27/2018 Document Reviewed: 07/27/2018 Elsevier Patient Education  2020 [employer]  Diabetes Mellitus and Nutrition, Adult When you have diabetes (diabetes mellitus), it is very important to have healthy eating habits because your blood sugar (glucose) levels are greatly affected by what you eat and drink. Eating healthy foods in the appropriate amounts, at about the same times every day, can help you:  Control your blood glucose.  Lower your risk of heart disease.  Improve your blood pressure.  Reach or maintain a healthy weight. Every person with diabetes is different, and each person has different needs for a meal plan. Your health care provider may  recommend that you work with a diet and nutrition specialist (dietitian) to make a meal plan that is best for you. Your meal plan may vary depending on factors such as:  The calories you need.  The medicines you take.  Your weight.  Your blood glucose, blood pressure, and cholesterol levels.  Your activity level.  Other health conditions you have, such as heart or kidney disease. How do carbohydrates affect me? Carbohydrates, also called carbs, affect your blood glucose level more than any other type of food. Eating carbs naturally raises the amount of glucose in your blood. Carb counting is a method for keeping track of how many carbs you eat. Counting carbs is important to keep your   blood glucose at a healthy level, especially if you use insulin or take certain oral diabetes medicines. It is important to know how many carbs you can safely have in each meal. This is different for every person. Your dietitian can help you calculate how many carbs you should have at each meal and for each snack. Foods that contain carbs include:  Bread, cereal, rice, pasta, and crackers.  Potatoes and corn.  Peas, beans, and lentils.  Milk and yogurt.  Fruit and juice.  Desserts, such as cakes, cookies, ice cream, and candy. How does alcohol affect me? Alcohol can cause a sudden decrease in blood glucose (hypoglycemia), especially if you use insulin or take certain oral diabetes medicines. Hypoglycemia can be a life-threatening condition. Symptoms of hypoglycemia (sleepiness, dizziness, and confusion) are similar to symptoms of having too much alcohol. If your health care provider says that alcohol is safe for you, follow these guidelines:  Limit alcohol intake to no more than 1 drink per day for nonpregnant women and 2 drinks per day for men. One drink equals 12 oz of beer, 5 oz of wine, or 1 oz of hard liquor.  Do not drink on an empty stomach.  Keep yourself hydrated with water, diet soda, or  unsweetened iced tea.  Keep in mind that regular soda, juice, and other mixers may contain a lot of sugar and must be counted as carbs. What are tips for following this plan?  Reading food labels  Start by checking the serving size on the "Nutrition Facts" label of packaged foods and drinks. The amount of calories, carbs, fats, and other nutrients listed on the label is based on one serving of the item. Many items contain more than one serving per package.  Check the total grams (g) of carbs in one serving. You can calculate the number of servings of carbs in one serving by dividing the total carbs by 15. For example, if a food has 30 g of total carbs, it would be equal to 2 servings of carbs.  Check the number of grams (g) of saturated and trans fats in one serving. Choose foods that have low or no amount of these fats.  Check the number of milligrams (mg) of salt (sodium) in one serving. Most people should limit total sodium intake to less than 2,300 mg per day.  Always check the nutrition information of foods labeled as "low-fat" or "nonfat". These foods may be higher in added sugar or refined carbs and should be avoided.  Talk to your dietitian to identify your daily goals for nutrients listed on the label. Shopping  Avoid buying canned, premade, or processed foods. These foods tend to be high in fat, sodium, and added sugar.  Shop around the outside edge of the grocery store. This includes fresh fruits and vegetables, bulk grains, fresh meats, and fresh dairy. Cooking  Use low-heat cooking methods, such as baking, instead of high-heat cooking methods like deep frying.  Cook using healthy oils, such as olive, canola, or sunflower oil.  Avoid cooking with butter, cream, or high-fat meats. Meal planning  Eat meals and snacks regularly, preferably at the same times every day. Avoid going long periods of time without eating.  Eat foods high in fiber, such as fresh fruits,  vegetables, beans, and whole grains. Talk to your dietitian about how many servings of carbs you can eat at each meal.  Eat 4-6 ounces (oz) of lean protein each day, such as lean meat, chicken, fish,   eggs, or tofu. One oz of lean protein is equal to: ? 1 oz of meat, chicken, or fish. ? 1 egg. ?  cup of tofu.  Eat some foods each day that contain healthy fats, such as avocado, nuts, seeds, and fish. Lifestyle  Check your blood glucose regularly.  Exercise regularly as told by your health care provider. This may include: ? 150 minutes of moderate-intensity or vigorous-intensity exercise each week. This could be brisk walking, biking, or water aerobics. ? Stretching and doing strength exercises, such as yoga or weightlifting, at least 2 times a week.  Take medicines as told by your health care provider.  Do not use any products that contain nicotine or tobacco, such as cigarettes and e-cigarettes. If you need help quitting, ask your health care provider.  Work with a counselor or diabetes educator to identify strategies to manage stress and any emotional and social challenges. Questions to ask a health care provider  Do I need to meet with a diabetes educator?  Do I need to meet with a dietitian?  What number can I call if I have questions?  When are the best times to check my blood glucose? Where to find more information:  American Diabetes Association: diabetes.org  Academy of Nutrition and Dietetics: www.eatright.org  National Institute of Diabetes and Digestive and Kidney Diseases (NIH): www.niddk.nih.gov Summary  A healthy meal plan will help you control your blood glucose and maintain a healthy lifestyle.  Working with a diet and nutrition specialist (dietitian) can help you make a meal plan that is best for you.  Keep in mind that carbohydrates (carbs) and alcohol have immediate effects on your blood glucose levels. It is important to count carbs and to use alcohol  carefully. This information is not intended to replace advice given to you by your health care provider. Make sure you discuss any questions you have with your health care provider. Document Released: 08/13/2005 Document Revised: 10/29/2017 Document Reviewed: 12/21/2016 Elsevier Patient Education  2020 [employer]  

## 2019-05-30 NOTE — Progress Notes (Signed)
Kevin Roy 43 y.o.   Chief Complaint  Patient presents with   Diabetes    follow up    HISTORY OF PRESENT ILLNESS: This is a 43 y.o. male with history of diabetes and hypertension here for follow-up.  Doing well.  Compliant with medications.  Has no complaints or medical concerns today.  HPI   Prior to Admission medications   Medication Sig Start Date End Date Taking? Authorizing Provider  amLODipine (NORVASC) 10 MG tablet Take 1 tablet (10 mg total) by mouth daily. 12/14/18  Yes Adamary Savary, Ines Bloomer, MD  aspirin (SB LOW DOSE ASA EC) 81 MG EC tablet Take 1 tablet (81 mg total) by mouth daily. Swallow whole. 02/16/18  Yes Tereasa Coop, PA-C  rosuvastatin (CRESTOR) 20 MG tablet TAKE 1 TABLET(20 MG) BY MOUTH DAILY 05/19/19  Yes Eural Holzschuh, Ines Bloomer, MD  metFORMIN (GLUCOPHAGE) 500 MG tablet Take 2 tablets (1,000 mg total) by mouth 2 (two) times daily with a meal. 12/14/18 03/14/19  Joylyn Duggin, Ines Bloomer, MD  varenicline (CHANTIX STARTING MONTH PAK) 0.5 MG X 11 & 1 MG X 42 tablet Take one 0.5 mg tablet by mouth once daily for 3 days, then increase to one 0.5 mg tablet twice daily for 4 days, then increase to one 1 mg tablet twice daily. Patient not taking: Reported on 05/30/2019 11/04/18   Forrest Moron, MD    No Known Allergies  Patient Active Problem List   Diagnosis Date Noted   Essential hypertension 12/14/2018   Smoker 11/04/2018   Type 2 diabetes mellitus without complication, without long-term current use of insulin ([place]) 11/04/2018   Acute URI 11/04/2018   Obstructive sleep apnea treated with continuous positive airway pressure (CPAP) 02/16/2018   Snoring 09/29/2017   Excessive daytime sleepiness 09/29/2017   Sleep deprivation 09/29/2017   OSA (obstructive sleep apnea) 09/29/2017    Past Medical History:  Diagnosis Date   Hypertension    OSA on CPAP     No past surgical history on file.  Social History   Socioeconomic History   Marital status:  Single    Spouse name: Not on file   Number of children: Not on file   Years of education: Not on file   Highest education level: Not on file  Occupational History   Not on file  Social Needs   Financial resource strain: Not on file   Food insecurity    Worry: Not on file    Inability: Not on file   Transportation needs    Medical: Not on file    Non-medical: Not on file  Tobacco Use   Smoking status: Current Every Day Smoker    Packs/day: 0.50    Years: 25.00    Pack years: 12.50    Types: Cigarettes   Smokeless tobacco: Never Used   Tobacco comment: 02/16/18 1/2 PPD  Substance and Sexual Activity   Alcohol use: Yes    Alcohol/week: 1.0 standard drinks    Types: 1 Cans of beer per week    Comment: OCCASSIONALY   Drug use: No   Sexual activity: Not on file  Lifestyle   Physical activity    Days per week: Not on file    Minutes per session: Not on file   Stress: Not on file  Relationships   Social connections    Talks on phone: Not on file    Gets together: Not on file    Attends religious service: Not on file  Active member of club or organization: Not on file    Attends meetings of clubs or organizations: Not on file    Relationship status: Not on file   Intimate partner violence    Fear of current or ex partner: Not on file    Emotionally abused: Not on file    Physically abused: Not on file    Forced sexual activity: Not on file  Other Topics Concern   Not on file  Social History Narrative   Not on file    Family History  Problem Relation Age of Onset   Hypertension Mother    Hyperlipidemia Mother    Cancer Father    Hyperlipidemia Maternal Grandmother      Review of Systems  Constitutional: Negative.  Negative for chills and fever.  HENT: Negative.  Negative for congestion, nosebleeds and sore throat.   Eyes: Negative.   Respiratory: Negative.  Negative for cough and shortness of breath.   Cardiovascular: Negative.   Negative for chest pain and palpitations.  Gastrointestinal: Negative for abdominal pain, diarrhea, nausea and vomiting.  Genitourinary: Negative.  Negative for dysuria.  Skin: Negative.  Negative for rash.  Endo/Heme/Allergies: Negative.   All other systems reviewed and are negative.    Vitals:   05/30/19 1400  BP: (!) 142/91  Pulse: 90  Resp: 16  Temp: 99.1 F (37.3 C)  SpO2: 98%     Physical Exam Vitals signs reviewed.  Constitutional:      Appearance: Normal appearance.  HENT:     Head: Normocephalic and atraumatic.     Nose: Nose normal.     Mouth/Throat:     Mouth: Mucous membranes are moist.     Pharynx: Oropharynx is clear.  Eyes:     Extraocular Movements: Extraocular movements intact.     Conjunctiva/sclera: Conjunctivae normal.     Pupils: Pupils are equal, round, and reactive to light.  Neck:     Musculoskeletal: Normal range of motion and neck supple.  Cardiovascular:     Rate and Rhythm: Normal rate and regular rhythm.     Heart sounds: Normal heart sounds.  Pulmonary:     Effort: Pulmonary effort is normal.     Breath sounds: Normal breath sounds.  Musculoskeletal: Normal range of motion.  Skin:    General: Skin is warm and dry.     Capillary Refill: Capillary refill takes less than 2 seconds.  Neurological:     General: No focal deficit present.     Mental Status: He is alert and oriented to person, place, and time.  Psychiatric:        Mood and Affect: Mood normal.        Behavior: Behavior normal.    A total of 25 minutes was spent in the room with the patient, greater than 50% of which was in counseling/coordination of care regarding diabetes and hypertension and cardiovascular risks associated with him, importance of diet and nutrition as well as exercise, prognosis, medication and side effects, and need for follow-up.   ASSESSMENT & PLAN: Kevin Roy was seen today for diabetes.  Diagnoses and all orders for this visit:  Type 2 diabetes  mellitus without complication, without long-term current use of insulin (HCC) -     Comprehensive metabolic panel -     Lipid panel -     Hemoglobin A1c  Essential hypertension -     Comprehensive metabolic panel -     Lipid panel -     Hemoglobin  A1c  Need for prophylactic vaccination against Streptococcus pneumoniae (pneumococcus) -     Pneumococcal polysaccharide vaccine 23-valent greater than or equal to 2yo subcutaneous/IM    Patient Instructions       If you have lab work done today you will be contacted with your lab results within the next 2 weeks.  If you have not heard from us then please contact us. The fastest way to get your results is to register for My Chart.   IF you received an x-ray today, you will receive an invoice from [facility]. Please contact [facility] at [phone] with questions or concerns regarding your invoice.   IF you received labwork today, you will receive an invoice from [facility]. Please contact [facility] at [phone] with questions or concerns regarding your invoice.   Our billing staff will not be able to assist you with questions regarding bills from these companies.  You will be contacted with the lab results as soon as they are available. The fastest way to get your results is to activate your My Chart account. Instructions are located on the last page of this paperwork. If you have not heard from us regarding the results in 2 weeks, please contact this office.     Hypertension, Adult High blood pressure (hypertension) is when the force of blood pumping through the arteries is too strong. The arteries are the blood vessels that carry blood from the heart throughout the body. Hypertension forces the heart to work harder to pump blood and may cause arteries to become narrow or stiff. Untreated or uncontrolled hypertension can cause a heart attack, heart failure, a stroke, kidney disease, and other problems. A blood  pressure reading consists of a higher number over a lower number. Ideally, your blood pressure should be below 120/80. The first ("top") number is called the systolic pressure. It is a measure of the pressure in your arteries as your heart beats. The second ("bottom") number is called the diastolic pressure. It is a measure of the pressure in your arteries as the heart relaxes. What are the causes? The exact cause of this condition is not known. There are some conditions that result in or are related to high blood pressure. What increases the risk? Some risk factors for high blood pressure are under your control. The following factors may make you more likely to develop this condition:  Smoking.  Having type 2 diabetes mellitus, high cholesterol, or both.  Not getting enough exercise or physical activity.  Being overweight.  Having too much fat, sugar, calories, or salt (sodium) in your diet.  Drinking too much alcohol. Some risk factors for high blood pressure may be difficult or impossible to change. Some of these factors include:  Having chronic kidney disease.  Having a family history of high blood pressure.  Age. Risk increases with age.  Race. You may be at higher risk if you are African American.  Gender. Men are at higher risk than women before age 44. After age 44, women are at higher risk than men.  Having obstructive sleep apnea.  Stress. What are the signs or symptoms? High blood pressure may not cause symptoms. Very high blood pressure (hypertensive crisis) may cause:  Headache.  Anxiety.  Shortness of breath.  Nosebleed.  Nausea and vomiting.  Vision changes.  Severe chest pain.  Seizures. How is this diagnosed? This condition is diagnosed by measuring your blood pressure while you are seated, with your arm resting on a flat surface,  your legs uncrossed, and your feet flat on the floor. The cuff of the blood pressure monitor will be placed directly  against the skin of your upper arm at the level of your heart. It should be measured at least twice using the same arm. Certain conditions can cause a difference in blood pressure between your right and left arms. Certain factors can cause blood pressure readings to be lower or higher than normal for a short period of time:  When your blood pressure is higher when you are in a health care provider's office than when you are at home, this is called white coat hypertension. Most people with this condition do not need medicines.  When your blood pressure is higher at home than when you are in a health care provider's office, this is called masked hypertension. Most people with this condition may need medicines to control blood pressure. If you have a high blood pressure reading during one visit or you have normal blood pressure with other risk factors, you may be asked to:  Return on a different day to have your blood pressure checked again.  Monitor your blood pressure at home for 1 week or longer. If you are diagnosed with hypertension, you may have other blood or imaging tests to help your health care provider understand your overall risk for other conditions. How is this treated? This condition is treated by making healthy lifestyle changes, such as eating healthy foods, exercising more, and reducing your alcohol intake. Your health care provider may prescribe medicine if lifestyle changes are not enough to get your blood pressure under control, and if:  Your systolic blood pressure is above 130.  Your diastolic blood pressure is above 80. Your personal target blood pressure may vary depending on your medical conditions, your age, and other factors. Follow these instructions at home: Eating and drinking   Eat a diet that is high in fiber and potassium, and low in sodium, added sugar, and fat. An example eating plan is called the DASH (Dietary Approaches to Stop Hypertension) diet. To eat this  way: ? Eat plenty of fresh fruits and vegetables. Try to fill one half of your plate at each meal with fruits and vegetables. ? Eat whole grains, such as whole-wheat pasta, brown rice, or whole-grain bread. Fill about one fourth of your plate with whole grains. ? Eat or drink low-fat dairy products, such as skim milk or low-fat yogurt. ? Avoid fatty cuts of meat, processed or cured meats, and poultry with skin. Fill about one fourth of your plate with lean proteins, such as fish, chicken without skin, beans, eggs, or tofu. ? Avoid pre-made and processed foods. These tend to be higher in sodium, added sugar, and fat.  Reduce your daily sodium intake. Most people with hypertension should eat less than 1,500 mg of sodium a day.  Do not drink alcohol if: ? Your health care provider tells you not to drink. ? You are pregnant, may be pregnant, or are planning to become pregnant.  If you drink alcohol: ? Limit how much you use to:  0-1 drink a day for women.  0-2 drinks a day for men. ? Be aware of how much alcohol is in your drink. In the [place] one drink equals one 12 oz bottle of beer (355 mL), one 5 oz glass of wine (148 mL), or one 1 oz glass of hard liquor (44 mL). Lifestyle   Work with your health care provider to maintain a  healthy body weight or to lose weight. Ask what an ideal weight is for you.  Get at least 30 minutes of exercise most days of the week. Activities may include walking, swimming, or biking.  Include exercise to strengthen your muscles (resistance exercise), such as Pilates or lifting weights, as part of your weekly exercise routine. Try to do these types of exercises for 30 minutes at least 3 days a week.  Do not use any products that contain nicotine or tobacco, such as cigarettes, e-cigarettes, and chewing tobacco. If you need help quitting, ask your health care provider.  Monitor your blood pressure at home as told by your health care provider.  Keep all  follow-up visits as told by your health care provider. This is important. Medicines  Take over-the-counter and prescription medicines only as told by your health care provider. Follow directions carefully. Blood pressure medicines must be taken as prescribed.  Do not skip doses of blood pressure medicine. Doing this puts you at risk for problems and can make the medicine less effective.  Ask your health care provider about side effects or reactions to medicines that you should watch for. Contact a health care provider if you:  Think you are having a reaction to a medicine you are taking.  Have headaches that keep coming back (recurring).  Feel dizzy.  Have swelling in your ankles.  Have trouble with your vision. Get help right away if you:  Develop a severe headache or confusion.  Have unusual weakness or numbness.  Feel faint.  Have severe pain in your chest or abdomen.  Vomit repeatedly.  Have trouble breathing. Summary  Hypertension is when the force of blood pumping through your arteries is too strong. If this condition is not controlled, it may put you at risk for serious complications.  Your personal target blood pressure may vary depending on your medical conditions, your age, and other factors. For most people, a normal blood pressure is less than 120/80.  Hypertension is treated with lifestyle changes, medicines, or a combination of both. Lifestyle changes include losing weight, eating a healthy, low-sodium diet, exercising more, and limiting alcohol. This information is not intended to replace advice given to you by your health care provider. Make sure you discuss any questions you have with your health care provider. Document Released: 11/16/2005 Document Revised: 07/27/2018 Document Reviewed: 07/27/2018 Elsevier Patient Education  2020 [employer].  Diabetes Mellitus and Nutrition, Adult When you have diabetes (diabetes mellitus), it is very important to have  healthy eating habits because your blood sugar (glucose) levels are greatly affected by what you eat and drink. Eating healthy foods in the appropriate amounts, at about the same times every day, can help you:  Control your blood glucose.  Lower your risk of heart disease.  Improve your blood pressure.  Reach or maintain a healthy weight. Every person with diabetes is different, and each person has different needs for a meal plan. Your health care provider may recommend that you work with a diet and nutrition specialist (dietitian) to make a meal plan that is best for you. Your meal plan may vary depending on factors such as:  The calories you need.  The medicines you take.  Your weight.  Your blood glucose, blood pressure, and cholesterol levels.  Your activity level.  Other health conditions you have, such as heart or kidney disease. How do carbohydrates affect me? Carbohydrates, also called carbs, affect your blood glucose level more than any other type  of food. Eating carbs naturally raises the amount of glucose in your blood. Carb counting is a method for keeping track of how many carbs you eat. Counting carbs is important to keep your blood glucose at a healthy level, especially if you use insulin or take certain oral diabetes medicines. It is important to know how many carbs you can safely have in each meal. This is different for every person. Your dietitian can help you calculate how many carbs you should have at each meal and for each snack. Foods that contain carbs include:  Bread, cereal, rice, pasta, and crackers.  Potatoes and corn.  Peas, beans, and lentils.  Milk and yogurt.  Fruit and juice.  Desserts, such as cakes, cookies, ice cream, and candy. How does alcohol affect me? Alcohol can cause a sudden decrease in blood glucose (hypoglycemia), especially if you use insulin or take certain oral diabetes medicines. Hypoglycemia can be a life-threatening condition.  Symptoms of hypoglycemia (sleepiness, dizziness, and confusion) are similar to symptoms of having too much alcohol. If your health care provider says that alcohol is safe for you, follow these guidelines:  Limit alcohol intake to no more than 1 drink per day for nonpregnant women and 2 drinks per day for men. One drink equals 12 oz of beer, 5 oz of wine, or 1 oz of hard liquor.  Do not drink on an empty stomach.  Keep yourself hydrated with water, diet soda, or unsweetened iced tea.  Keep in mind that regular soda, juice, and other mixers may contain a lot of sugar and must be counted as carbs. What are tips for following this plan?  Reading food labels  Start by checking the serving size on the "Nutrition Facts" label of packaged foods and drinks. The amount of calories, carbs, fats, and other nutrients listed on the label is based on one serving of the item. Many items contain more than one serving per package.  Check the total grams (g) of carbs in one serving. You can calculate the number of servings of carbs in one serving by dividing the total carbs by 15. For example, if a food has 30 g of total carbs, it would be equal to 2 servings of carbs.  Check the number of grams (g) of saturated and trans fats in one serving. Choose foods that have low or no amount of these fats.  Check the number of milligrams (mg) of salt (sodium) in one serving. Most people should limit total sodium intake to less than 2,300 mg per day.  Always check the nutrition information of foods labeled as "low-fat" or "nonfat". These foods may be higher in added sugar or refined carbs and should be avoided.  Talk to your dietitian to identify your daily goals for nutrients listed on the label. Shopping  Avoid buying canned, premade, or processed foods. These foods tend to be high in fat, sodium, and added sugar.  Shop around the outside edge of the grocery store. This includes fresh fruits and vegetables, bulk  grains, fresh meats, and fresh dairy. Cooking  Use low-heat cooking methods, such as baking, instead of high-heat cooking methods like deep frying.  Cook using healthy oils, such as olive, canola, or sunflower oil.  Avoid cooking with butter, cream, or high-fat meats. Meal planning  Eat meals and snacks regularly, preferably at the same times every day. Avoid going long periods of time without eating.  Eat foods high in fiber, such as fresh fruits, vegetables, beans, and  whole grains. Talk to your dietitian about how many servings of carbs you can eat at each meal.  Eat 4-6 ounces (oz) of lean protein each day, such as lean meat, chicken, fish, eggs, or tofu. One oz of lean protein is equal to: ? 1 oz of meat, chicken, or fish. ? 1 egg. ?  cup of tofu.  Eat some foods each day that contain healthy fats, such as avocado, nuts, seeds, and fish. Lifestyle  Check your blood glucose regularly.  Exercise regularly as told by your health care provider. This may include: ? 150 minutes of moderate-intensity or vigorous-intensity exercise each week. This could be brisk walking, biking, or water aerobics. ? Stretching and doing strength exercises, such as yoga or weightlifting, at least 2 times a week.  Take medicines as told by your health care provider.  Do not use any products that contain nicotine or tobacco, such as cigarettes and e-cigarettes. If you need help quitting, ask your health care provider.  Work with a Veterinary surgeon or diabetes educator to identify strategies to manage stress and any emotional and social challenges. Questions to ask a health care provider  Do I need to meet with a diabetes educator?  Do I need to meet with a dietitian?  What number can I call if I have questions?  When are the best times to check my blood glucose? Where to find more information:  American Diabetes Association: diabetes.org  Academy of Nutrition and Dietetics:  www.eatright.[employer] of Diabetes and Digestive and Kidney Diseases (NIH): CarFli[employer].tn Summary  A healthy meal plan will help you control your blood glucose and maintain a healthy lifestyle.  Working with a diet and nutrition specialist (dietitian) can help you make a meal plan that is best for you.  Keep in mind that carbohydrates (carbs) and alcohol have immediate effects on your blood glucose levels. It is important to count carbs and to use alcohol carefully. This information is not intended to replace advice given to you by your health care provider. Make sure you discuss any questions you have with your health care provider. Document Released: 08/13/2005 Document Revised: 10/29/2017 Document Reviewed: 12/21/2016 Elsevier Patient Education  2020 Elsevier Inc.      Edwina Barth, MD [facility]

## 2019-05-31 LAB — COMPREHENSIVE METABOLIC PANEL
ALT: 24 IU/L (ref 0–44)
AST: 20 IU/L (ref 0–40)
Albumin/Globulin Ratio: 1.6 (ref 1.2–2.2)
Albumin: 4.4 g/dL (ref 4.0–5.0)
Alkaline Phosphatase: 70 IU/L (ref 39–117)
BUN/Creatinine Ratio: 14 (ref 9–20)
BUN: 10 mg/dL (ref 6–24)
Bilirubin Total: <0.2 mg/dL (ref 0.0–1.2)
CO2: 23 mmol/L (ref 20–29)
Calcium: 9.5 mg/dL (ref 8.7–10.2)
Chloride: 106 mmol/L (ref 96–106)
Creatinine, Ser: 0.72 mg/dL — ABNORMAL LOW (ref 0.76–1.27)
GFR calc Af Amer: 132 mL/min/{1.73_m2} (ref >59)
GFR calc non Af Amer: 114 mL/min/{1.73_m2} (ref >59)
Globulin, Total: 2.7 g/dL (ref 1.5–4.5)
Glucose: 128 mg/dL — ABNORMAL HIGH (ref 65–99)
Potassium: 3.7 mmol/L (ref 3.5–5.2)
Sodium: 142 mmol/L (ref 134–144)
Total Protein: 7.1 g/dL (ref 6.0–8.5)

## 2019-05-31 LAB — LIPID PANEL
Chol/HDL Ratio: 4.2 ratio (ref 0.0–5.0)
Cholesterol, Total: 151 mg/dL (ref 100–199)
HDL: 36 mg/dL — ABNORMAL LOW (ref >39)
LDL Calculated: 57 mg/dL (ref 0–99)
Triglycerides: 289 mg/dL — ABNORMAL HIGH (ref 0–149)
VLDL Cholesterol Cal: 58 mg/dL — ABNORMAL HIGH (ref 5–40)

## 2019-05-31 LAB — HEMOGLOBIN A1C
Est. average glucose Bld gHb Est-mCnc: 154 mg/dL
Hgb A1c MFr Bld: 7 % — ABNORMAL HIGH (ref 4.8–5.6)

## 2019-06-01 ENCOUNTER — Encounter: Payer: Self-pay | Admitting: Emergency Medicine

## 2019-06-29 ENCOUNTER — Other Ambulatory Visit: Payer: Self-pay | Admitting: Emergency Medicine

## 2019-06-29 DIAGNOSIS — E119 Type 2 diabetes mellitus without complications: Secondary | ICD-10-CM

## 2019-06-29 NOTE — Telephone Encounter (Signed)
Forwarding medication refill request to clinical pool for review. 

## 2019-06-30 ENCOUNTER — Other Ambulatory Visit: Payer: Self-pay | Admitting: *Deleted

## 2019-06-30 NOTE — Telephone Encounter (Signed)
Refilled Metformin 500 mg until patient's next appointment 11/20/2019.

## 2019-07-27 ENCOUNTER — Other Ambulatory Visit: Payer: Self-pay

## 2019-07-27 DIAGNOSIS — Z20822 Contact with and (suspected) exposure to covid-19: Secondary | ICD-10-CM

## 2019-07-27 DIAGNOSIS — R6889 Other general symptoms and signs: Secondary | ICD-10-CM | POA: Diagnosis not present

## 2019-07-29 LAB — NOVEL CORONAVIRUS, NAA: SARS-CoV-2, NAA: NOT DETECTED

## 2019-07-31 ENCOUNTER — Other Ambulatory Visit: Payer: Self-pay | Admitting: Emergency Medicine

## 2019-07-31 DIAGNOSIS — Z9189 Other specified personal risk factors, not elsewhere classified: Secondary | ICD-10-CM

## 2019-08-02 DIAGNOSIS — G4733 Obstructive sleep apnea (adult) (pediatric): Secondary | ICD-10-CM | POA: Diagnosis not present

## 2019-11-20 ENCOUNTER — Other Ambulatory Visit: Payer: Self-pay

## 2019-11-20 ENCOUNTER — Ambulatory Visit: Payer: BC Managed Care – PPO | Admitting: Emergency Medicine

## 2019-12-07 ENCOUNTER — Other Ambulatory Visit: Payer: Self-pay

## 2019-12-07 ENCOUNTER — Ambulatory Visit: Payer: BC Managed Care – PPO | Admitting: Emergency Medicine

## 2019-12-07 ENCOUNTER — Encounter: Payer: Self-pay | Admitting: Emergency Medicine

## 2019-12-07 VITALS — BP 134/94 | HR 82 | Temp 98.7°F | Resp 19 | Ht 74.5 in | Wt 279.0 lb

## 2019-12-07 DIAGNOSIS — I152 Hypertension secondary to endocrine disorders: Secondary | ICD-10-CM

## 2019-12-07 DIAGNOSIS — E1159 Type 2 diabetes mellitus with other circulatory complications: Secondary | ICD-10-CM | POA: Diagnosis not present

## 2019-12-07 DIAGNOSIS — I10 Essential (primary) hypertension: Secondary | ICD-10-CM | POA: Diagnosis not present

## 2019-12-07 DIAGNOSIS — E119 Type 2 diabetes mellitus without complications: Secondary | ICD-10-CM

## 2019-12-07 DIAGNOSIS — Z23 Encounter for immunization: Secondary | ICD-10-CM

## 2019-12-07 LAB — POCT GLYCOSYLATED HEMOGLOBIN (HGB A1C): Hemoglobin A1C: 8.2 % — AB (ref 4.0–5.6)

## 2019-12-07 LAB — GLUCOSE, POCT (MANUAL RESULT ENTRY): POC Glucose: 138 mg/dl — AB (ref 70–99)

## 2019-12-07 MED ORDER — AMLODIPINE BESYLATE 10 MG PO TABS: 10.0000 mg | ORAL_TABLET | Freq: Every day | ORAL | 3 refills | Status: DC

## 2019-12-07 MED ORDER — GLIPIZIDE 5 MG PO TABS: 5.0000 mg | ORAL_TABLET | Freq: Two times a day (BID) | ORAL | 1 refills | Status: DC

## 2019-12-07 MED ORDER — LISINOPRIL 10 MG PO TABS: 10.0000 mg | ORAL_TABLET | Freq: Every day | ORAL | 3 refills | Status: DC

## 2019-12-07 MED ORDER — ROSUVASTATIN CALCIUM 20 MG PO TABS: ORAL_TABLET | ORAL | 3 refills | Status: DC

## 2019-12-07 MED ORDER — METFORMIN HCL 500 MG PO TABS: 1000.0000 mg | ORAL_TABLET | Freq: Two times a day (BID) | ORAL | 3 refills | Status: DC

## 2019-12-07 NOTE — Assessment & Plan Note (Signed)
Elevated blood pressure today.  Continue amlodipine 10 mg daily.  We will add lisinopril 10 mg daily. Uncontrolled diabetes with hemoglobin A1c of 8.2 higher than before.  Diet and nutrition discussed with patient.  Continue metformin 1000 mg twice a day and add glipizide 5 mg twice a day with food.  Hypoglycemia precautions given.  Continue rosuvastatin 20 mg daily.   Follow-up in 3 months.

## 2019-12-07 NOTE — Patient Instructions (Addendum)
   If you have lab work done today you will be contacted with your lab results within the next 2 weeks.  If you have not heard from us then please contact us. The fastest way to get your results is to register for My Chart.   IF you received an x-ray today, you will receive an invoice from [facility]. Please contact [facility] at [phone] with questions or concerns regarding your invoice.   IF you received labwork today, you will receive an invoice from [facility]. Please contact [facility] at [phone] with questions or concerns regarding your invoice.   Our billing staff will not be able to assist you with questions regarding bills from these companies.  You will be contacted with the lab results as soon as they are available. The fastest way to get your results is to activate your My Chart account. Instructions are located on the last page of this paperwork. If you have not heard from us regarding the results in 2 weeks, please contact this office.      Hypertension, Adult High blood pressure (hypertension) is when the force of blood pumping through the arteries is too strong. The arteries are the blood vessels that carry blood from the heart throughout the body. Hypertension forces the heart to work harder to pump blood and may cause arteries to become narrow or stiff. Untreated or uncontrolled hypertension can cause a heart attack, heart failure, a stroke, kidney disease, and other problems. A blood pressure reading consists of a higher number over a lower number. Ideally, your blood pressure should be below 120/80. The first ("top") number is called the systolic pressure. It is a measure of the pressure in your arteries as your heart beats. The second ("bottom") number is called the diastolic pressure. It is a measure of the pressure in your arteries as the heart relaxes. What are the causes? The exact cause of this condition is not known. There are some conditions  that result in or are related to high blood pressure. What increases the risk? Some risk factors for high blood pressure are under your control. The following factors may make you more likely to develop this condition:  Smoking.  Having type 2 diabetes mellitus, high cholesterol, or both.  Not getting enough exercise or physical activity.  Being overweight.  Having too much fat, sugar, calories, or salt (sodium) in your diet.  Drinking too much alcohol. Some risk factors for high blood pressure may be difficult or impossible to change. Some of these factors include:  Having chronic kidney disease.  Having a family history of high blood pressure.  Age. Risk increases with age.  Race. You may be at higher risk if you are African American.  Gender. Men are at higher risk than women before age 45. After age 65, women are at higher risk than men.  Having obstructive sleep apnea.  Stress. What are the signs or symptoms? High blood pressure may not cause symptoms. Very high blood pressure (hypertensive crisis) may cause:  Headache.  Anxiety.  Shortness of breath.  Nosebleed.  Nausea and vomiting.  Vision changes.  Severe chest pain.  Seizures. How is this diagnosed? This condition is diagnosed by measuring your blood pressure while you are seated, with your arm resting on a flat surface, your legs uncrossed, and your feet flat on the floor. The cuff of the blood pressure monitor will be placed directly against the skin of your upper arm at the level of your   heart. It should be measured at least twice using the same arm. Certain conditions can cause a difference in blood pressure between your right and left arms. Certain factors can cause blood pressure readings to be lower or higher than normal for a short period of time:  When your blood pressure is higher when you are in a health care provider's office than when you are at home, this is called white coat hypertension.  Most people with this condition do not need medicines.  When your blood pressure is higher at home than when you are in a health care provider's office, this is called masked hypertension. Most people with this condition may need medicines to control blood pressure. If you have a high blood pressure reading during one visit or you have normal blood pressure with other risk factors, you may be asked to:  Return on a different day to have your blood pressure checked again.  Monitor your blood pressure at home for 1 week or longer. If you are diagnosed with hypertension, you may have other blood or imaging tests to help your health care provider understand your overall risk for other conditions. How is this treated? This condition is treated by making healthy lifestyle changes, such as eating healthy foods, exercising more, and reducing your alcohol intake. Your health care provider may prescribe medicine if lifestyle changes are not enough to get your blood pressure under control, and if:  Your systolic blood pressure is above 130.  Your diastolic blood pressure is above 80. Your personal target blood pressure may vary depending on your medical conditions, your age, and other factors. Follow these instructions at home: Eating and drinking   Eat a diet that is high in fiber and potassium, and low in sodium, added sugar, and fat. An example eating plan is called the DASH (Dietary Approaches to Stop Hypertension) diet. To eat this way: ? Eat plenty of fresh fruits and vegetables. Try to fill one half of your plate at each meal with fruits and vegetables. ? Eat whole grains, such as whole-wheat pasta, brown rice, or whole-grain bread. Fill about one fourth of your plate with whole grains. ? Eat or drink low-fat dairy products, such as skim milk or low-fat yogurt. ? Avoid fatty cuts of meat, processed or cured meats, and poultry with skin. Fill about one fourth of your plate with lean proteins, such  as fish, chicken without skin, beans, eggs, or tofu. ? Avoid pre-made and processed foods. These tend to be higher in sodium, added sugar, and fat.  Reduce your daily sodium intake. Most people with hypertension should eat less than 1,500 mg of sodium a day.  Do not drink alcohol if: ? Your health care provider tells you not to drink. ? You are pregnant, may be pregnant, or are planning to become pregnant.  If you drink alcohol: ? Limit how much you use to:  0-1 drink a day for women.  0-2 drinks a day for men. ? Be aware of how much alcohol is in your drink. In the [place] one drink equals one 12 oz bottle of beer (355 mL), one 5 oz glass of wine (148 mL), or one 1 oz glass of hard liquor (44 mL). Lifestyle   Work with your health care provider to maintain a healthy body weight or to lose weight. Ask what an ideal weight is for you.  Get at least 30 minutes of exercise most days of the week. Activities may include walking, swimming,   or biking.  Include exercise to strengthen your muscles (resistance exercise), such as Pilates or lifting weights, as part of your weekly exercise routine. Try to do these types of exercises for 30 minutes at least 3 days a week.  Do not use any products that contain nicotine or tobacco, such as cigarettes, e-cigarettes, and chewing tobacco. If you need help quitting, ask your health care provider.  Monitor your blood pressure at home as told by your health care provider.  Keep all follow-up visits as told by your health care provider. This is important. Medicines  Take over-the-counter and prescription medicines only as told by your health care provider. Follow directions carefully. Blood pressure medicines must be taken as prescribed.  Do not skip doses of blood pressure medicine. Doing this puts you at risk for problems and can make the medicine less effective.  Ask your health care provider about side effects or reactions to medicines that you  should watch for. Contact a health care provider if you:  Think you are having a reaction to a medicine you are taking.  Have headaches that keep coming back (recurring).  Feel dizzy.  Have swelling in your ankles.  Have trouble with your vision. Get help right away if you:  Develop a severe headache or confusion.  Have unusual weakness or numbness.  Feel faint.  Have severe pain in your chest or abdomen.  Vomit repeatedly.  Have trouble breathing. Summary  Hypertension is when the force of blood pumping through your arteries is too strong. If this condition is not controlled, it may put you at risk for serious complications.  Your personal target blood pressure may vary depending on your medical conditions, your age, and other factors. For most people, a normal blood pressure is less than 120/80.  Hypertension is treated with lifestyle changes, medicines, or a combination of both. Lifestyle changes include losing weight, eating a healthy, low-sodium diet, exercising more, and limiting alcohol. This information is not intended to replace advice given to you by your health care provider. Make sure you discuss any questions you have with your health care provider. Document Revised: 07/27/2018 Document Reviewed: 07/27/2018 Elsevier Patient Education  2020 [employer]  Diabetes Mellitus and Nutrition, Adult When you have diabetes (diabetes mellitus), it is very important to have healthy eating habits because your blood sugar (glucose) levels are greatly affected by what you eat and drink. Eating healthy foods in the appropriate amounts, at about the same times every day, can help you:  Control your blood glucose.  Lower your risk of heart disease.  Improve your blood pressure.  Reach or maintain a healthy weight. Every person with diabetes is different, and each person has different needs for a meal plan. Your health care provider may recommend that you work with a diet  and nutrition specialist (dietitian) to make a meal plan that is best for you. Your meal plan may vary depending on factors such as:  The calories you need.  The medicines you take.  Your weight.  Your blood glucose, blood pressure, and cholesterol levels.  Your activity level.  Other health conditions you have, such as heart or kidney disease. How do carbohydrates affect me? Carbohydrates, also called carbs, affect your blood glucose level more than any other type of food. Eating carbs naturally raises the amount of glucose in your blood. Carb counting is a method for keeping track of how many carbs you eat. Counting carbs is important to keep your blood glucose   at a healthy level, especially if you use insulin or take certain oral diabetes medicines. It is important to know how many carbs you can safely have in each meal. This is different for every person. Your dietitian can help you calculate how many carbs you should have at each meal and for each snack. Foods that contain carbs include:  Bread, cereal, rice, pasta, and crackers.  Potatoes and corn.  Peas, beans, and lentils.  Milk and yogurt.  Fruit and juice.  Desserts, such as cakes, cookies, ice cream, and candy. How does alcohol affect me? Alcohol can cause a sudden decrease in blood glucose (hypoglycemia), especially if you use insulin or take certain oral diabetes medicines. Hypoglycemia can be a life-threatening condition. Symptoms of hypoglycemia (sleepiness, dizziness, and confusion) are similar to symptoms of having too much alcohol. If your health care provider says that alcohol is safe for you, follow these guidelines:  Limit alcohol intake to no more than 1 drink per day for nonpregnant women and 2 drinks per day for men. One drink equals 12 oz of beer, 5 oz of wine, or 1 oz of hard liquor.  Do not drink on an empty stomach.  Keep yourself hydrated with water, diet soda, or unsweetened iced tea.  Keep in  mind that regular soda, juice, and other mixers may contain a lot of sugar and must be counted as carbs. What are tips for following this plan?  Reading food labels  Start by checking the serving size on the "Nutrition Facts" label of packaged foods and drinks. The amount of calories, carbs, fats, and other nutrients listed on the label is based on one serving of the item. Many items contain more than one serving per package.  Check the total grams (g) of carbs in one serving. You can calculate the number of servings of carbs in one serving by dividing the total carbs by 15. For example, if a food has 30 g of total carbs, it would be equal to 2 servings of carbs.  Check the number of grams (g) of saturated and trans fats in one serving. Choose foods that have low or no amount of these fats.  Check the number of milligrams (mg) of salt (sodium) in one serving. Most people should limit total sodium intake to less than 2,300 mg per day.  Always check the nutrition information of foods labeled as "low-fat" or "nonfat". These foods may be higher in added sugar or refined carbs and should be avoided.  Talk to your dietitian to identify your daily goals for nutrients listed on the label. Shopping  Avoid buying canned, premade, or processed foods. These foods tend to be high in fat, sodium, and added sugar.  Shop around the outside edge of the grocery store. This includes fresh fruits and vegetables, bulk grains, fresh meats, and fresh dairy. Cooking  Use low-heat cooking methods, such as baking, instead of high-heat cooking methods like deep frying.  Cook using healthy oils, such as olive, canola, or sunflower oil.  Avoid cooking with butter, cream, or high-fat meats. Meal planning  Eat meals and snacks regularly, preferably at the same times every day. Avoid going long periods of time without eating.  Eat foods high in fiber, such as fresh fruits, vegetables, beans, and whole grains. Talk  to your dietitian about how many servings of carbs you can eat at each meal.  Eat 4-6 ounces (oz) of lean protein each day, such as lean meat, chicken, fish, eggs, or   tofu. One oz of lean protein is equal to: ? 1 oz of meat, chicken, or fish. ? 1 egg. ?  cup of tofu.  Eat some foods each day that contain healthy fats, such as avocado, nuts, seeds, and fish. Lifestyle  Check your blood glucose regularly.  Exercise regularly as told by your health care provider. This may include: ? 150 minutes of moderate-intensity or vigorous-intensity exercise each week. This could be brisk walking, biking, or water aerobics. ? Stretching and doing strength exercises, such as yoga or weightlifting, at least 2 times a week.  Take medicines as told by your health care provider.  Do not use any products that contain nicotine or tobacco, such as cigarettes and e-cigarettes. If you need help quitting, ask your health care provider.  Work with a counselor or diabetes educator to identify strategies to manage stress and any emotional and social challenges. Questions to ask a health care provider  Do I need to meet with a diabetes educator?  Do I need to meet with a dietitian?  What number can I call if I have questions?  When are the best times to check my blood glucose? Where to find more information:  American Diabetes Association: diabetes.org  Academy of Nutrition and Dietetics: www.eatright.org  National Institute of Diabetes and Digestive and Kidney Diseases (NIH): www.niddk.nih.gov Summary  A healthy meal plan will help you control your blood glucose and maintain a healthy lifestyle.  Working with a diet and nutrition specialist (dietitian) can help you make a meal plan that is best for you.  Keep in mind that carbohydrates (carbs) and alcohol have immediate effects on your blood glucose levels. It is important to count carbs and to use alcohol carefully. This information is not intended  to replace advice given to you by your health care provider. Make sure you discuss any questions you have with your health care provider. Document Revised: 10/29/2017 Document Reviewed: 12/21/2016 Elsevier Patient Education  2020 [employer]  

## 2019-12-07 NOTE — Progress Notes (Signed)
Lab Results  Component Value Date   HGBA1C 7.0 (H) 05/30/2019   BP Readings from Last 3 Encounters:  05/30/19 (!) 142/91  12/14/18 (!) 152/100  11/04/18 (!) 148/94   Lab Results  Component Value Date   CHOL 151 05/30/2019   HDL 36 (L) 05/30/2019   LDLCALC 57 05/30/2019   TRIG 289 (H) 05/30/2019   CHOLHDL 4.2 05/30/2019   Lab Results  Component Value Date   CREATININE 0.72 (L) 05/30/2019   BUN 10 05/30/2019   NA 142 05/30/2019   K 3.7 05/30/2019   CL 106 05/30/2019   CO2 23 05/30/2019   Kevin LampJulian Roy 44 y.o.   Chief Complaint  Patient presents with  . Diabetes    6 month follow up  . Medication Refill    PEND    HISTORY OF PRESENT ILLNESS: This is a 44 y.o. male with history of diabetes and hypertension here for follow-up and medication refill. 1.  Hypertension: On amlodipine 10 mg daily. 2.  Diabetes: On Metformin 1000 mg twice a day.  Also on rosuvastatin 20 mg daily. No complaints or medical concerns today.  Doing well.  HPI   Prior to Admission medications   Medication Sig Start Date End Date Taking? Authorizing Provider  amLODipine (NORVASC) 10 MG tablet Take 1 tablet (10 mg total) by mouth daily. 12/14/18  Yes Mishayla Sliwinski, Eilleen KempfMiguel Jose, MD  aspirin (SB LOW DOSE ASA EC) 81 MG EC tablet Take 1 tablet (81 mg total) by mouth daily. Swallow whole. 02/16/18  Yes Ofilia Neaslark, Michael L, PA-C  metFORMIN (GLUCOPHAGE) 500 MG tablet TAKE 2 TABLETS(1000 MG) BY MOUTH TWICE DAILY WITH A MEAL 06/30/19  Yes Jaylean Buenaventura, Eilleen KempfMiguel Jose, MD  Multiple Vitamin (MULTI-VITAMIN PO) Take by mouth daily.   Yes [provider]  rosuvastatin (CRESTOR) 20 MG tablet TAKE 1 TABLET(20 MG) BY MOUTH DAILY 07/31/19  Yes Thecla Forgione, Eilleen KempfMiguel Jose, MD  varenicline (CHANTIX STARTING MONTH PAK) 0.5 MG X 11 & 1 MG X 42 tablet Take one 0.5 mg tablet by mouth once daily for 3 days, then increase to one 0.5 mg tablet twice daily for 4 days, then increase to one 1 mg tablet twice daily. Patient not taking: Reported  on 05/30/2019 11/04/18   Doristine BosworthStallings, Zoe A, MD    No Known Allergies  Patient Active Problem List   Diagnosis Date Noted  . Essential hypertension 12/14/2018  . Smoker 11/04/2018  . Type 2 diabetes mellitus without complication, without long-term current use of insulin (HCC) 11/04/2018  . Obstructive sleep apnea treated with continuous positive airway pressure (CPAP) 02/16/2018  . Snoring 09/29/2017  . Excessive daytime sleepiness 09/29/2017  . Sleep deprivation 09/29/2017  . OSA (obstructive sleep apnea) 09/29/2017    Past Medical History:  Diagnosis Date  . Hypertension   . OSA on CPAP     No past surgical history on file.  Social History   Socioeconomic History  . Marital status: Single    Spouse name: Not on file  . Number of children: Not on file  . Years of education: Not on file  . Highest education level: Not on file  Occupational History  . Not on file  Tobacco Use  . Smoking status: Current Every Day Smoker    Packs/day: 0.50    Years: 25.00    Pack years: 12.50    Types: Cigarettes  . Smokeless tobacco: Never Used  . Tobacco comment: 02/16/18 1/2 PPD  Substance and Sexual Activity  . Alcohol use: Yes  Alcohol/week: 1.0 standard drinks    Types: 1 Cans of beer per week    Comment: OCCASSIONALY  . Drug use: No  . Sexual activity: Not on file  Other Topics Concern  . Not on file  Social History Narrative  . Not on file   Social Determinants of Health   Financial Resource Strain:   . Difficulty of Paying Living Expenses: Not on file  Food Insecurity:   . Worried About Programme researcher, broadcasting/film/videounning Out of Food in the Last Year: Not on file  . Ran Out of Food in the Last Year: Not on file  Transportation Needs:   . Lack of Transportation (Medical): Not on file  . Lack of Transportation (Non-Medical): Not on file  Physical Activity:   . Days of Exercise per Week: Not on file  . Minutes of Exercise per Session: Not on file  Stress:   . Feeling of Stress : Not on file    Social Connections:   . Frequency of Communication with Friends and Family: Not on file  . Frequency of Social Gatherings with Friends and Family: Not on file  . Attends Religious Services: Not on file  . Active Member of Clubs or Organizations: Not on file  . Attends BankerClub or Organization Meetings: Not on file  . Marital Status: Not on file  Intimate Partner Violence:   . Fear of Current or Ex-Partner: Not on file  . Emotionally Abused: Not on file  . Physically Abused: Not on file  . Sexually Abused: Not on file    Family History  Problem Relation Age of Onset  . Hypertension Mother   . Hyperlipidemia Mother   . Cancer Father   . Hyperlipidemia Maternal Grandmother      Review of Systems  Constitutional: Negative.  Negative for chills and fever.  HENT: Negative.  Negative for congestion and sore throat.   Eyes: Negative.   Respiratory: Negative.  Negative for cough and shortness of breath.   Cardiovascular: Negative.  Negative for chest pain and palpitations.  Gastrointestinal: Negative.  Negative for abdominal pain, blood in stool, diarrhea, melena, nausea and vomiting.  Genitourinary: Negative.  Negative for dysuria and hematuria.  Musculoskeletal: Negative.  Negative for myalgias.  Skin: Negative.  Negative for rash.  Neurological: Negative.  Negative for dizziness and headaches.  Endo/Heme/Allergies: Negative.   All other systems reviewed and are negative.  Today's Vitals   12/07/19 1330  BP: (!) 146/93  Pulse: 82  Resp: 19  Temp: 98.7 F (37.1 C)  TempSrc: Temporal  SpO2: 98%  Weight: 279 lb (126.6 kg)  Height: 6' 2.5" (1.892 m)   Body mass index is 35.34 kg/m.   Physical Exam Vitals reviewed.  Constitutional:      Appearance: Normal appearance.  HENT:     Head: Normocephalic.  Eyes:     Extraocular Movements: Extraocular movements intact.     Conjunctiva/sclera: Conjunctivae normal.     Pupils: Pupils are equal, round, and reactive to light.   Cardiovascular:     Rate and Rhythm: Normal rate and regular rhythm.     Pulses: Normal pulses.     Heart sounds: Normal heart sounds.  Pulmonary:     Effort: Pulmonary effort is normal.     Breath sounds: Normal breath sounds.  Musculoskeletal:        General: Normal range of motion.     Cervical back: Normal range of motion and neck supple.  Skin:    General: Skin is warm  and dry.     Capillary Refill: Capillary refill takes less than 2 seconds.  Neurological:     General: No focal deficit present.     Mental Status: He is alert and oriented to person, place, and time.    Results for orders placed or performed in visit on 12/07/19 (from the past 24 hour(s))  POCT glucose (manual entry)     Status: Abnormal   Collection Time: 12/07/19  2:08 PM  Result Value Ref Range   POC Glucose 138 (A) 70 - 99 mg/dl  POCT glycosylated hemoglobin (Hb A1C)     Status: Abnormal   Collection Time: 12/07/19  2:13 PM  Result Value Ref Range   Hemoglobin A1C 8.2 (A) 4.0 - 5.6 %   HbA1c POC (<> result, manual entry)     HbA1c, POC (prediabetic range)     HbA1c, POC (controlled diabetic range)     A total of 40 minutes was spent in the room with the patient, greater than 50% of which was in counseling/coordination of care regarding diabetes and hypertension, cardiovascular risks associated with these conditions, management including diet and nutrition and medications, review of most recent blood work, discussion of new medications, hypoglycemia symptoms and how to treat it, prognosis and need for follow-up in 3 months.   ASSESSMENT & PLAN: Hypertension associated with type 2 diabetes mellitus (HCC) Elevated blood pressure today.  Continue amlodipine 10 mg daily.  We will add lisinopril 10 mg daily. Uncontrolled diabetes with hemoglobin A1c of 8.2 higher than before.  Diet and nutrition discussed with patient.  Continue metformin 1000 mg twice a day and add glipizide 5 mg twice a day with food.   Hypoglycemia precautions given.  Continue rosuvastatin 20 mg daily.   Follow-up in 3 months.  Kevin Roy was seen today for diabetes and medication refill.  Diagnoses and all orders for this visit:  Hypertension associated with type 2 diabetes mellitus (HCC) -     POCT glucose (manual entry) -     POCT glycosylated hemoglobin (Hb A1C) -     Comprehensive metabolic panel -     Lipid panel -     amLODipine (NORVASC) 10 MG tablet; Take 1 tablet (10 mg total) by mouth daily. -     metFORMIN (GLUCOPHAGE) 500 MG tablet; Take 2 tablets (1,000 mg total) by mouth 2 (two) times daily with a meal. -     rosuvastatin (CRESTOR) 20 MG tablet; TAKE 1 TABLET(20 MG) BY MOUTH DAILY -     Ambulatory referral to Ophthalmology -     lisinopril (ZESTRIL) 10 MG tablet; Take 1 tablet (10 mg total) by mouth daily. -     glipiZIDE (GLUCOTROL) 5 MG tablet; Take 1 tablet (5 mg total) by mouth 2 (two) times daily before a meal.  Essential hypertension  Type 2 diabetes mellitus without complication, without long-term current use of insulin (HCC)  Need for prophylactic vaccination and inoculation against influenza  Other orders -     Flu Vaccine QUAD 36+ mos IM    Patient Instructions       If you have lab work done today you will be contacted with your lab results within the next 2 weeks.  If you have not heard from [place] then please contact us. The fastest way to get your results is to register for My Chart.   IF you received an x-ray today, you will receive an invoice from [facility]. Please contact [facility] at [phone] with  questions or concerns regarding your invoice.   IF you received labwork today, you will receive an invoice from [facility]. Please contact [facility] at [phone] with questions or concerns regarding your invoice.   Our billing staff will not be able to assist you with questions regarding bills from these companies.  You will be contacted with the lab results  as soon as they are available. The fastest way to get your results is to activate your My Chart account. Instructions are located on the last page of this paperwork. If you have not heard from [place] regarding the results in 2 weeks, please contact this office.     Hypertension, Adult High blood pressure (hypertension) is when the force of blood pumping through the arteries is too strong. The arteries are the blood vessels that carry blood from the heart throughout the body. Hypertension forces the heart to work harder to pump blood and may cause arteries to become narrow or stiff. Untreated or uncontrolled hypertension can cause a heart attack, heart failure, a stroke, kidney disease, and other problems. A blood pressure reading consists of a higher number over a lower number. Ideally, your blood pressure should be below 120/80. The first ("top") number is called the systolic pressure. It is a measure of the pressure in your arteries as your heart beats. The second ("bottom") number is called the diastolic pressure. It is a measure of the pressure in your arteries as the heart relaxes. What are the causes? The exact cause of this condition is not known. There are some conditions that result in or are related to high blood pressure. What increases the risk? Some risk factors for high blood pressure are under your control. The following factors may make you more likely to develop this condition:  Smoking.  Having type 2 diabetes mellitus, high cholesterol, or both.  Not getting enough exercise or physical activity.  Being overweight.  Having too much fat, sugar, calories, or salt (sodium) in your diet.  Drinking too much alcohol. Some risk factors for high blood pressure may be difficult or impossible to change. Some of these factors include:  Having chronic kidney disease.  Having a family history of high blood pressure.  Age. Risk increases with age.  Race. You may be at higher risk if  you are African American.  Gender. Men are at higher risk than women before age 78. After age 35, women are at higher risk than men.  Having obstructive sleep apnea.  Stress. What are the signs or symptoms? High blood pressure may not cause symptoms. Very high blood pressure (hypertensive crisis) may cause:  Headache.  Anxiety.  Shortness of breath.  Nosebleed.  Nausea and vomiting.  Vision changes.  Severe chest pain.  Seizures. How is this diagnosed? This condition is diagnosed by measuring your blood pressure while you are seated, with your arm resting on a flat surface, your legs uncrossed, and your feet flat on the floor. The cuff of the blood pressure monitor will be placed directly against the skin of your upper arm at the level of your heart. It should be measured at least twice using the same arm. Certain conditions can cause a difference in blood pressure between your right and left arms. Certain factors can cause blood pressure readings to be lower or higher than normal for a short period of time:  When your blood pressure is higher when you are in a health care provider's office than when you are at home, this is  called white coat hypertension. Most people with this condition do not need medicines.  When your blood pressure is higher at home than when you are in a health care provider's office, this is called masked hypertension. Most people with this condition may need medicines to control blood pressure. If you have a high blood pressure reading during one visit or you have normal blood pressure with other risk factors, you may be asked to:  Return on a different day to have your blood pressure checked again.  Monitor your blood pressure at home for 1 week or longer. If you are diagnosed with hypertension, you may have other blood or imaging tests to help your health care provider understand your overall risk for other conditions. How is this treated? This  condition is treated by making healthy lifestyle changes, such as eating healthy foods, exercising more, and reducing your alcohol intake. Your health care provider may prescribe medicine if lifestyle changes are not enough to get your blood pressure under control, and if:  Your systolic blood pressure is above 130.  Your diastolic blood pressure is above 80. Your personal target blood pressure may vary depending on your medical conditions, your age, and other factors. Follow these instructions at home: Eating and drinking   Eat a diet that is high in fiber and potassium, and low in sodium, added sugar, and fat. An example eating plan is called the DASH (Dietary Approaches to Stop Hypertension) diet. To eat this way: ? Eat plenty of fresh fruits and vegetables. Try to fill one half of your plate at each meal with fruits and vegetables. ? Eat whole grains, such as whole-wheat pasta, brown rice, or whole-grain bread. Fill about one fourth of your plate with whole grains. ? Eat or drink low-fat dairy products, such as skim milk or low-fat yogurt. ? Avoid fatty cuts of meat, processed or cured meats, and poultry with skin. Fill about one fourth of your plate with lean proteins, such as fish, chicken without skin, beans, eggs, or tofu. ? Avoid pre-made and processed foods. These tend to be higher in sodium, added sugar, and fat.  Reduce your daily sodium intake. Most people with hypertension should eat less than 1,500 mg of sodium a day.  Do not drink alcohol if: ? Your health care provider tells you not to drink. ? You are pregnant, may be pregnant, or are planning to become pregnant.  If you drink alcohol: ? Limit how much you use to:  0-1 drink a day for women.  0-2 drinks a day for men. ? Be aware of how much alcohol is in your drink. In the [place] one drink equals one 12 oz bottle of beer (355 mL), one 5 oz glass of wine (148 mL), or one 1 oz glass of hard liquor (44  mL). Lifestyle   Work with your health care provider to maintain a healthy body weight or to lose weight. Ask what an ideal weight is for you.  Get at least 30 minutes of exercise most days of the week. Activities may include walking, swimming, or biking.  Include exercise to strengthen your muscles (resistance exercise), such as Pilates or lifting weights, as part of your weekly exercise routine. Try to do these types of exercises for 30 minutes at least 3 days a week.  Do not use any products that contain nicotine or tobacco, such as cigarettes, e-cigarettes, and chewing tobacco. If you need help quitting, ask your health care provider.  Monitor your  blood pressure at home as told by your health care provider.  Keep all follow-up visits as told by your health care provider. This is important. Medicines  Take over-the-counter and prescription medicines only as told by your health care provider. Follow directions carefully. Blood pressure medicines must be taken as prescribed.  Do not skip doses of blood pressure medicine. Doing this puts you at risk for problems and can make the medicine less effective.  Ask your health care provider about side effects or reactions to medicines that you should watch for. Contact a health care provider if you:  Think you are having a reaction to a medicine you are taking.  Have headaches that keep coming back (recurring).  Feel dizzy.  Have swelling in your ankles.  Have trouble with your vision. Get help right away if you:  Develop a severe headache or confusion.  Have unusual weakness or numbness.  Feel faint.  Have severe pain in your chest or abdomen.  Vomit repeatedly.  Have trouble breathing. Summary  Hypertension is when the force of blood pumping through your arteries is too strong. If this condition is not controlled, it may put you at risk for serious complications.  Your personal target blood pressure may vary depending on  your medical conditions, your age, and other factors. For most people, a normal blood pressure is less than 120/80.  Hypertension is treated with lifestyle changes, medicines, or a combination of both. Lifestyle changes include losing weight, eating a healthy, low-sodium diet, exercising more, and limiting alcohol. This information is not intended to replace advice given to you by your health care provider. Make sure you discuss any questions you have with your health care provider. Document Revised: 07/27/2018 Document Reviewed: 07/27/2018 Elsevier Patient Education  [place].  Diabetes Mellitus and Nutrition, Adult When you have diabetes (diabetes mellitus), it is very important to have healthy eating habits because your blood sugar (glucose) levels are greatly affected by what you eat and drink. Eating healthy foods in the appropriate amounts, at about the same times every day, can help you:  Control your blood glucose.  Lower your risk of heart disease.  Improve your blood pressure.  Reach or maintain a healthy weight. Every person with diabetes is different, and each person has different needs for a meal plan. Your health care provider may recommend that you work with a diet and nutrition specialist (dietitian) to make a meal plan that is best for you. Your meal plan may vary depending on factors such as:  The calories you need.  The medicines you take.  Your weight.  Your blood glucose, blood pressure, and cholesterol levels.  Your activity level.  Other health conditions you have, such as heart or kidney disease. How do carbohydrates affect me? Carbohydrates, also called carbs, affect your blood glucose level more than any other type of food. Eating carbs naturally raises the amount of glucose in your blood. Carb counting is a method for keeping track of how many carbs you eat. Counting carbs is important to keep your blood glucose at a healthy level, especially if you  use insulin or take certain oral diabetes medicines. It is important to know how many carbs you can safely have in each meal. This is different for every person. Your dietitian can help you calculate how many carbs you should have at each meal and for each snack. Foods that contain carbs include:  Bread, cereal, rice, pasta, and crackers.  Potatoes and corn.  Peas, beans, and lentils.  Milk and yogurt.  Fruit and juice.  Desserts, such as cakes, cookies, ice cream, and candy. How does alcohol affect me? Alcohol can cause a sudden decrease in blood glucose (hypoglycemia), especially if you use insulin or take certain oral diabetes medicines. Hypoglycemia can be a life-threatening condition. Symptoms of hypoglycemia (sleepiness, dizziness, and confusion) are similar to symptoms of having too much alcohol. If your health care provider says that alcohol is safe for you, follow these guidelines:  Limit alcohol intake to no more than 1 drink per day for nonpregnant women and 2 drinks per day for men. One drink equals 12 oz of beer, 5 oz of wine, or 1 oz of hard liquor.  Do not drink on an empty stomach.  Keep yourself hydrated with water, diet soda, or unsweetened iced tea.  Keep in mind that regular soda, juice, and other mixers may contain a lot of sugar and must be counted as carbs. What are tips for following this plan?  Reading food labels  Start by checking the serving size on the "Nutrition Facts" label of packaged foods and drinks. The amount of calories, carbs, fats, and other nutrients listed on the label is based on one serving of the item. Many items contain more than one serving per package.  Check the total grams (g) of carbs in one serving. You can calculate the number of servings of carbs in one serving by dividing the total carbs by 15. For example, if a food has 30 g of total carbs, it would be equal to 2 servings of carbs.  Check the number of grams (g) of saturated  and trans fats in one serving. Choose foods that have low or no amount of these fats.  Check the number of milligrams (mg) of salt (sodium) in one serving. Most people should limit total sodium intake to less than 2,300 mg per day.  Always check the nutrition information of foods labeled as "low-fat" or "nonfat". These foods may be higher in added sugar or refined carbs and should be avoided.  Talk to your dietitian to identify your daily goals for nutrients listed on the label. Shopping  Avoid buying canned, premade, or processed foods. These foods tend to be high in fat, sodium, and added sugar.  Shop around the outside edge of the grocery store. This includes fresh fruits and vegetables, bulk grains, fresh meats, and fresh dairy. Cooking  Use low-heat cooking methods, such as baking, instead of high-heat cooking methods like deep frying.  Cook using healthy oils, such as olive, canola, or sunflower oil.  Avoid cooking with butter, cream, or high-fat meats. Meal planning  Eat meals and snacks regularly, preferably at the same times every day. Avoid going long periods of time without eating.  Eat foods high in fiber, such as fresh fruits, vegetables, beans, and whole grains. Talk to your dietitian about how many servings of carbs you can eat at each meal.  Eat 4-6 ounces (oz) of lean protein each day, such as lean meat, chicken, fish, eggs, or tofu. One oz of lean protein is equal to: ? 1 oz of meat, chicken, or fish. ? 1 egg. ?  cup of tofu.  Eat some foods each day that contain healthy fats, such as avocado, nuts, seeds, and fish. Lifestyle  Check your blood glucose regularly.  Exercise regularly as told by your health care provider. This may include: ? 150 minutes of moderate-intensity or vigorous-intensity exercise each week. This  could be brisk walking, biking, or water aerobics. ? Stretching and doing strength exercises, such as yoga or weightlifting, at least 2 times a  week.  Take medicines as told by your health care provider.  Do not use any products that contain nicotine or tobacco, such as cigarettes and e-cigarettes. If you need help quitting, ask your health care provider.  Work with a Veterinary surgeon or diabetes educator to identify strategies to manage stress and any emotional and social challenges. Questions to ask a health care provider  Do I need to meet with a diabetes educator?  Do I need to meet with a dietitian?  What number can I call if I have questions?  When are the best times to check my blood glucose? Where to find more information:  American Diabetes Association: diabetes.org  Academy of Nutrition and Dietetics: www.eatright.[employer] of Diabetes and Digestive and Kidney Diseases (NIH): CarFlippers.tn Summary  A healthy meal plan will help you control your blood glucose and maintain a healthy lifestyle.  Working with a diet and nutrition specialist (dietitian) can help you make a meal plan that is best for you.  Keep in mind that carbohydrates (carbs) and alcohol have immediate effects on your blood glucose levels. It is important to count carbs and to use alcohol carefully. This information is not intended to replace advice given to you by your health care provider. Make sure you discuss any questions you have with your health care provider. Document Revised: 10/29/2017 Document Reviewed: 12/21/2016 Elsevier Patient Education  2020 Elsevier Inc.      Edwina Barth, MD [facility]

## 2019-12-08 LAB — COMPREHENSIVE METABOLIC PANEL
ALT: 21 IU/L (ref 0–44)
AST: 17 IU/L (ref 0–40)
Albumin/Globulin Ratio: 1.9 (ref 1.2–2.2)
Albumin: 4.7 g/dL (ref 4.0–5.0)
Alkaline Phosphatase: 84 IU/L (ref 39–117)
BUN/Creatinine Ratio: 16 (ref 9–20)
BUN: 11 mg/dL (ref 6–24)
Bilirubin Total: 0.2 mg/dL (ref 0.0–1.2)
CO2: 24 mmol/L (ref 20–29)
Calcium: 9.3 mg/dL (ref 8.7–10.2)
Chloride: 102 mmol/L (ref 96–106)
Creatinine, Ser: 0.67 mg/dL — ABNORMAL LOW (ref 0.76–1.27)
GFR calc Af Amer: 135 mL/min/{1.73_m2} (ref >59)
GFR calc non Af Amer: 117 mL/min/{1.73_m2} (ref >59)
Globulin, Total: 2.5 g/dL (ref 1.5–4.5)
Glucose: 141 mg/dL — ABNORMAL HIGH (ref 65–99)
Potassium: 3.6 mmol/L (ref 3.5–5.2)
Sodium: 140 mmol/L (ref 134–144)
Total Protein: 7.2 g/dL (ref 6.0–8.5)

## 2019-12-08 LAB — LIPID PANEL
Chol/HDL Ratio: 3.2 ratio (ref 0.0–5.0)
Cholesterol, Total: 119 mg/dL (ref 100–199)
HDL: 37 mg/dL — ABNORMAL LOW (ref >39)
LDL Chol Calc (NIH): 58 mg/dL (ref 0–99)
Triglycerides: 133 mg/dL (ref 0–149)
VLDL Cholesterol Cal: 24 mg/dL (ref 5–40)

## 2020-01-03 DIAGNOSIS — H31111 Age-related choroidal atrophy, right eye: Secondary | ICD-10-CM | POA: Diagnosis not present

## 2020-01-03 DIAGNOSIS — E119 Type 2 diabetes mellitus without complications: Secondary | ICD-10-CM | POA: Diagnosis not present

## 2020-01-03 DIAGNOSIS — H524 Presbyopia: Secondary | ICD-10-CM | POA: Diagnosis not present

## 2020-01-03 LAB — HM DIABETES EYE EXAM

## 2020-01-08 ENCOUNTER — Encounter: Payer: Self-pay | Admitting: *Deleted

## 2020-01-08 ENCOUNTER — Encounter: Payer: Self-pay | Admitting: Emergency Medicine

## 2020-01-08 ENCOUNTER — Other Ambulatory Visit: Payer: Self-pay | Admitting: Emergency Medicine

## 2020-01-08 DIAGNOSIS — N529 Male erectile dysfunction, unspecified: Secondary | ICD-10-CM

## 2020-01-08 MED ORDER — SILDENAFIL CITRATE 100 MG PO TABS: 50.0000 mg | ORAL_TABLET | Freq: Every day | ORAL | 11 refills | Status: AC | PRN

## 2020-01-24 ENCOUNTER — Encounter: Payer: Self-pay | Admitting: Emergency Medicine

## 2020-02-26 ENCOUNTER — Ambulatory Visit: Payer: BC Managed Care – PPO | Admitting: Emergency Medicine

## 2020-03-25 ENCOUNTER — Ambulatory Visit: Payer: BC Managed Care – PPO | Admitting: Emergency Medicine

## 2020-04-01 ENCOUNTER — Encounter: Payer: Self-pay | Admitting: Emergency Medicine

## 2020-04-01 ENCOUNTER — Ambulatory Visit: Payer: BC Managed Care – PPO | Admitting: Emergency Medicine

## 2020-04-01 ENCOUNTER — Other Ambulatory Visit: Payer: Self-pay

## 2020-04-01 VITALS — BP 150/95 | HR 91 | Temp 98.7°F | Resp 16 | Ht 75.0 in | Wt 276.0 lb

## 2020-04-01 DIAGNOSIS — E1159 Type 2 diabetes mellitus with other circulatory complications: Secondary | ICD-10-CM

## 2020-04-01 DIAGNOSIS — G4733 Obstructive sleep apnea (adult) (pediatric): Secondary | ICD-10-CM

## 2020-04-01 DIAGNOSIS — I10 Essential (primary) hypertension: Secondary | ICD-10-CM | POA: Diagnosis not present

## 2020-04-01 DIAGNOSIS — Z9989 Dependence on other enabling machines and devices: Secondary | ICD-10-CM | POA: Diagnosis not present

## 2020-04-01 DIAGNOSIS — I152 Hypertension secondary to endocrine disorders: Secondary | ICD-10-CM

## 2020-04-01 LAB — POCT GLYCOSYLATED HEMOGLOBIN (HGB A1C): Hemoglobin A1C: 7.3 % — AB (ref 4.0–5.6)

## 2020-04-01 LAB — GLUCOSE, POCT (MANUAL RESULT ENTRY): POC Glucose: 174 mg/dl — AB (ref 70–99)

## 2020-04-01 MED ORDER — LISINOPRIL 20 MG PO TABS: 20.0000 mg | ORAL_TABLET | Freq: Every day | ORAL | 3 refills | Status: DC

## 2020-04-01 MED ORDER — SITAGLIPTIN PHOSPHATE 50 MG PO TABS: 50.0000 mg | ORAL_TABLET | Freq: Every day | ORAL | 3 refills | Status: DC

## 2020-04-01 NOTE — Progress Notes (Signed)
Kevin Roy 44 y.o.   Chief Complaint  Patient presents with  . Diabetes    follow up  . Medication Refill    Lisinopril    HISTORY OF PRESENT ILLNESS: This is a 44 y.o. male with history of diabetes and hypertension here for follow-up and medication refill. 1.  Hypertension: On amlodipine 10 mg and lisinopril 10 mg daily.  Off lisinopril for 1 week.  Does not check blood pressures at home. BP Readings from Last 3 Encounters:  04/01/20 (!) 150/95  12/07/19 (!) 134/94  05/30/19 (!) 142/91    2.  Diabetes: On Metformin 1000 mg twice a day.  Stopped glipizide due to side effects, most likely hypoglycemia.  On rosuvastatin 20 mg daily. Lab Results  Component Value Date   HGBA1C 8.2 (A) 12/07/2019     HPI   Prior to Admission medications   Medication Sig Start Date End Date Taking? Authorizing Provider  amLODipine (NORVASC) 10 MG tablet Take 1 tablet (10 mg total) by mouth daily. 12/07/19  Yes Ardel Jagger, Eilleen Kempf, MD  aspirin (SB LOW DOSE ASA EC) 81 MG EC tablet Take 1 tablet (81 mg total) by mouth daily. Swallow whole. 02/16/18  Yes Ofilia Neas, PA-C  lisinopril (ZESTRIL) 10 MG tablet Take 1 tablet (10 mg total) by mouth daily. 12/07/19  Yes SagardiaEilleen Kempf, MD  Multiple Vitamin (MULTI-VITAMIN PO) Take by mouth daily.   Yes [provider]  rosuvastatin (CRESTOR) 20 MG tablet TAKE 1 TABLET(20 MG) BY MOUTH DAILY 12/07/19  Yes Akiva Brassfield, Eilleen Kempf, MD  sildenafil (VIAGRA) 100 MG tablet Take 0.5-1 tablets (50-100 mg total) by mouth daily as needed for erectile dysfunction. 01/08/20  Yes Tanicia Wolaver, Eilleen Kempf, MD  glipiZIDE (GLUCOTROL) 5 MG tablet Take 1 tablet (5 mg total) by mouth 2 (two) times daily before a meal. 12/07/19 03/06/20  Georgina Quint, MD  metFORMIN (GLUCOPHAGE) 500 MG tablet Take 2 tablets (1,000 mg total) by mouth 2 (two) times daily with a meal. 12/07/19 03/06/20  Georgina Quint, MD    No Known Allergies  Patient Active Problem List   Diagnosis Date Noted  . Hypertension associated with type 2 diabetes mellitus (HCC) 12/14/2018  . Smoker 11/04/2018  . Type 2 diabetes mellitus without complication, without long-term current use of insulin (HCC) 11/04/2018  . Obstructive sleep apnea treated with continuous positive airway pressure (CPAP) 02/16/2018  . OSA (obstructive sleep apnea) 09/29/2017    Past Medical History:  Diagnosis Date  . Hypertension   . OSA on CPAP     History reviewed. No pertinent surgical history.  Social History   Socioeconomic History  . Marital status: Single    Spouse name: Not on file  . Number of children: Not on file  . Years of education: Not on file  . Highest education level: Not on file  Occupational History  . Not on file  Tobacco Use  . Smoking status: Current Every Day Smoker    Packs/day: 0.50    Years: 25.00    Pack years: 12.50    Types: Cigarettes  . Smokeless tobacco: Never Used  . Tobacco comment: 02/16/18 1/2 PPD  Substance and Sexual Activity  . Alcohol use: Yes    Alcohol/week: 1.0 standard drinks    Types: 1 Cans of beer per week    Comment: OCCASSIONALY  . Drug use: No  . Sexual activity: Not on file  Other Topics Concern  . Not on file  Social History Narrative  .  Not on file   Social Determinants of Health   Financial Resource Strain:   . Difficulty of Paying Living Expenses:   Food Insecurity:   . Worried About Programme researcher, broadcasting/film/videounning Out of Food in the Last Year:   . Baristaan Out of Food in the Last Year:   Transportation Needs:   . Freight forwarderLack of Transportation (Medical):   Marland Kitchen. Lack of Transportation (Non-Medical):   Physical Activity:   . Days of Exercise per Week:   . Minutes of Exercise per Session:   Stress:   . Feeling of Stress :   Social Connections:   . Frequency of Communication with Friends and Family:   . Frequency of Social Gatherings with Friends and Family:   . Attends Religious Services:   . Active Member of Clubs or Organizations:   . Attends Tax inspectorClub or  Organization Meetings:   Marland Kitchen. Marital Status:   Intimate Partner Violence:   . Fear of Current or Ex-Partner:   . Emotionally Abused:   Marland Kitchen. Physically Abused:   . Sexually Abused:     Family History  Problem Relation Age of Onset  . Hypertension Mother   . Hyperlipidemia Mother   . Cancer Father   . Hyperlipidemia Maternal Grandmother      Review of Systems  Constitutional: Negative.  Negative for chills and fever.  HENT: Negative.  Negative for congestion and sore throat.   Respiratory: Negative.  Negative for cough and shortness of breath.   Cardiovascular: Negative.  Negative for chest pain and palpitations.  Gastrointestinal: Negative.  Negative for abdominal pain, blood in stool, diarrhea, melena, nausea and vomiting.  Genitourinary: Negative.  Negative for dysuria and hematuria.  Skin: Negative.  Negative for rash.  Neurological: Negative.  Negative for dizziness and headaches.  Endo/Heme/Allergies: Negative.   All other systems reviewed and are negative.  Today's Vitals   04/01/20 1506  BP: (!) 150/95  Pulse: 91  Resp: 16  Temp: 98.7 F (37.1 C)  TempSrc: Temporal  SpO2: 96%  Weight: 276 lb (125.2 kg)  Height: 6\' 3"  (1.905 m)   Body mass index is 34.5 kg/m.   Physical Exam Vitals reviewed.  Constitutional:      Appearance: Normal appearance.  HENT:     Head: Normocephalic.  Eyes:     Extraocular Movements: Extraocular movements intact.     Conjunctiva/sclera: Conjunctivae normal.     Pupils: Pupils are equal, round, and reactive to light.  Cardiovascular:     Rate and Rhythm: Normal rate and regular rhythm.     Pulses: Normal pulses.     Heart sounds: Normal heart sounds.  Pulmonary:     Effort: Pulmonary effort is normal.     Breath sounds: Normal breath sounds.  Musculoskeletal:        General: Normal range of motion.     Cervical back: Normal range of motion and neck supple.  Skin:    General: Skin is warm and dry.     Capillary Refill:  Capillary refill takes less than 2 seconds.  Neurological:     General: No focal deficit present.     Mental Status: He is alert and oriented to person, place, and time.  Psychiatric:        Mood and Affect: Mood normal.        Behavior: Behavior normal.    Results for orders placed or performed in visit on 04/01/20 (from the past 24 hour(s))  POCT glucose (manual entry)     Status:  Abnormal   Collection Time: 04/01/20  3:45 PM  Result Value Ref Range   POC Glucose 174 (A) 70 - 99 mg/dl  POCT glycosylated hemoglobin (Hb A1C)     Status: Abnormal   Collection Time: 04/01/20  3:46 PM  Result Value Ref Range   Hemoglobin A1C 7.3 (A) 4.0 - 5.6 %   HbA1c POC (<> result, manual entry)     HbA1c, POC (prediabetic range)     HbA1c, POC (controlled diabetic range)       ASSESSMENT & PLAN: Hypertension associated with type 2 diabetes mellitus (HCC) Elevated blood pressure.  Continue amlodipine 10 mg daily.  Increase lisinopril to 20 mg daily.  Follow-up in 6 months. Hemoglobin A1c better than before at 7.3.  Continue Metformin 1000 mg twice a day.  I agree with stopping glipizide.  Start Januvia 50 mg daily. Follow-up in 6 months.  Connie was seen today for diabetes and medication refill.  Diagnoses and all orders for this visit:  Hypertension associated with type 2 diabetes mellitus ([place]) -     POCT glucose (manual entry) -     POCT glycosylated hemoglobin (Hb A1C) -     CBC with Differential/Platelet -     Comprehensive metabolic panel -     Lipid panel -     lisinopril (ZESTRIL) 20 MG tablet; Take 1 tablet (20 mg total) by mouth daily. -     sitaGLIPtin (JANUVIA) 50 MG tablet; Take 1 tablet (50 mg total) by mouth daily.  Obstructive sleep apnea treated with continuous positive airway pressure (CPAP)    Patient Instructions       If you have lab work done today you will be contacted with your lab results within the next 2 weeks.  If you have not heard from [place] then please  contact us. The fastest way to get your results is to register for My Chart.   IF you received an x-ray today, you will receive an invoice from [facility]. Please contact [facility] at [phone] with questions or concerns regarding your invoice.   IF you received labwork today, you will receive an invoice from [facility]. Please contact [facility] at [phone] with questions or concerns regarding your invoice.   Our billing staff will not be able to assist you with questions regarding bills from these companies.  You will be contacted with the lab results as soon as they are available. The fastest way to get your results is to activate your My Chart account. Instructions are located on the last page of this paperwork. If you have not heard from [place] regarding the results in 2 weeks, please contact this office.     Hypertension, Adult High blood pressure (hypertension) is when the force of blood pumping through the arteries is too strong. The arteries are the blood vessels that carry blood from the heart throughout the body. Hypertension forces the heart to work harder to pump blood and may cause arteries to become narrow or stiff. Untreated or uncontrolled hypertension can cause a heart attack, heart failure, a stroke, kidney disease, and other problems. A blood pressure reading consists of a higher number over a lower number. Ideally, your blood pressure should be below 120/80. The first ("top") number is called the systolic pressure. It is a measure of the pressure in your arteries as your heart beats. The second ("bottom") number is called the diastolic pressure. It is a measure of the pressure in your arteries as the heart relaxes.  What are the causes? The exact cause of this condition is not known. There are some conditions that result in or are related to high blood pressure. What increases the risk? Some risk factors for high blood pressure are under your control. The  following factors may make you more likely to develop this condition:  Smoking.  Having type 2 diabetes mellitus, high cholesterol, or both.  Not getting enough exercise or physical activity.  Being overweight.  Having too much fat, sugar, calories, or salt (sodium) in your diet.  Drinking too much alcohol. Some risk factors for high blood pressure may be difficult or impossible to change. Some of these factors include:  Having chronic kidney disease.  Having a family history of high blood pressure.  Age. Risk increases with age.  Race. You may be at higher risk if you are African American.  Gender. Men are at higher risk than women before age 40. After age 26, women are at higher risk than men.  Having obstructive sleep apnea.  Stress. What are the signs or symptoms? High blood pressure may not cause symptoms. Very high blood pressure (hypertensive crisis) may cause:  Headache.  Anxiety.  Shortness of breath.  Nosebleed.  Nausea and vomiting.  Vision changes.  Severe chest pain.  Seizures. How is this diagnosed? This condition is diagnosed by measuring your blood pressure while you are seated, with your arm resting on a flat surface, your legs uncrossed, and your feet flat on the floor. The cuff of the blood pressure monitor will be placed directly against the skin of your upper arm at the level of your heart. It should be measured at least twice using the same arm. Certain conditions can cause a difference in blood pressure between your right and left arms. Certain factors can cause blood pressure readings to be lower or higher than normal for a short period of time:  When your blood pressure is higher when you are in a health care provider's office than when you are at home, this is called white coat hypertension. Most people with this condition do not need medicines.  When your blood pressure is higher at home than when you are in a health care provider's  office, this is called masked hypertension. Most people with this condition may need medicines to control blood pressure. If you have a high blood pressure reading during one visit or you have normal blood pressure with other risk factors, you may be asked to:  Return on a different day to have your blood pressure checked again.  Monitor your blood pressure at home for 1 week or longer. If you are diagnosed with hypertension, you may have other blood or imaging tests to help your health care provider understand your overall risk for other conditions. How is this treated? This condition is treated by making healthy lifestyle changes, such as eating healthy foods, exercising more, and reducing your alcohol intake. Your health care provider may prescribe medicine if lifestyle changes are not enough to get your blood pressure under control, and if:  Your systolic blood pressure is above 130.  Your diastolic blood pressure is above 80. Your personal target blood pressure may vary depending on your medical conditions, your age, and other factors. Follow these instructions at home: Eating and drinking   Eat a diet that is high in fiber and potassium, and low in sodium, added sugar, and fat. An example eating plan is called the DASH (Dietary Approaches to Stop Hypertension) diet.  To eat this way: ? Eat plenty of fresh fruits and vegetables. Try to fill one half of your plate at each meal with fruits and vegetables. ? Eat whole grains, such as whole-wheat pasta, brown rice, or whole-grain bread. Fill about one fourth of your plate with whole grains. ? Eat or drink low-fat dairy products, such as skim milk or low-fat yogurt. ? Avoid fatty cuts of meat, processed or cured meats, and poultry with skin. Fill about one fourth of your plate with lean proteins, such as fish, chicken without skin, beans, eggs, or tofu. ? Avoid pre-made and processed foods. These tend to be higher in sodium, added sugar, and  fat.  Reduce your daily sodium intake. Most people with hypertension should eat less than 1,500 mg of sodium a day.  Do not drink alcohol if: ? Your health care provider tells you not to drink. ? You are pregnant, may be pregnant, or are planning to become pregnant.  If you drink alcohol: ? Limit how much you use to:  0-1 drink a day for women.  0-2 drinks a day for men. ? Be aware of how much alcohol is in your drink. In the [place] one drink equals one 12 oz bottle of beer (355 mL), one 5 oz glass of wine (148 mL), or one 1 oz glass of hard liquor (44 mL). Lifestyle   Work with your health care provider to maintain a healthy body weight or to lose weight. Ask what an ideal weight is for you.  Get at least 30 minutes of exercise most days of the week. Activities may include walking, swimming, or biking.  Include exercise to strengthen your muscles (resistance exercise), such as Pilates or lifting weights, as part of your weekly exercise routine. Try to do these types of exercises for 30 minutes at least 3 days a week.  Do not use any products that contain nicotine or tobacco, such as cigarettes, e-cigarettes, and chewing tobacco. If you need help quitting, ask your health care provider.  Monitor your blood pressure at home as told by your health care provider.  Keep all follow-up visits as told by your health care provider. This is important. Medicines  Take over-the-counter and prescription medicines only as told by your health care provider. Follow directions carefully. Blood pressure medicines must be taken as prescribed.  Do not skip doses of blood pressure medicine. Doing this puts you at risk for problems and can make the medicine less effective.  Ask your health care provider about side effects or reactions to medicines that you should watch for. Contact a health care provider if you:  Think you are having a reaction to a medicine you are taking.  Have headaches that  keep coming back (recurring).  Feel dizzy.  Have swelling in your ankles.  Have trouble with your vision. Get help right away if you:  Develop a severe headache or confusion.  Have unusual weakness or numbness.  Feel faint.  Have severe pain in your chest or abdomen.  Vomit repeatedly.  Have trouble breathing. Summary  Hypertension is when the force of blood pumping through your arteries is too strong. If this condition is not controlled, it may put you at risk for serious complications.  Your personal target blood pressure may vary depending on your medical conditions, your age, and other factors. For most people, a normal blood pressure is less than 120/80.  Hypertension is treated with lifestyle changes, medicines, or a combination of both. Lifestyle  changes include losing weight, eating a healthy, low-sodium diet, exercising more, and limiting alcohol. This information is not intended to replace advice given to you by your health care provider. Make sure you discuss any questions you have with your health care provider. Document Revised: 07/27/2018 Document Reviewed: 07/27/2018 Elsevier Patient Education  2020 [employer].  Diabetes Mellitus and Nutrition, Adult When you have diabetes (diabetes mellitus), it is very important to have healthy eating habits because your blood sugar (glucose) levels are greatly affected by what you eat and drink. Eating healthy foods in the appropriate amounts, at about the same times every day, can help you:  Control your blood glucose.  Lower your risk of heart disease.  Improve your blood pressure.  Reach or maintain a healthy weight. Every person with diabetes is different, and each person has different needs for a meal plan. Your health care provider may recommend that you work with a diet and nutrition specialist (dietitian) to make a meal plan that is best for you. Your meal plan may vary depending on factors such as:  The  calories you need.  The medicines you take.  Your weight.  Your blood glucose, blood pressure, and cholesterol levels.  Your activity level.  Other health conditions you have, such as heart or kidney disease. How do carbohydrates affect me? Carbohydrates, also called carbs, affect your blood glucose level more than any other type of food. Eating carbs naturally raises the amount of glucose in your blood. Carb counting is a method for keeping track of how many carbs you eat. Counting carbs is important to keep your blood glucose at a healthy level, especially if you use insulin or take certain oral diabetes medicines. It is important to know how many carbs you can safely have in each meal. This is different for every person. Your dietitian can help you calculate how many carbs you should have at each meal and for each snack. Foods that contain carbs include:  Bread, cereal, rice, pasta, and crackers.  Potatoes and corn.  Peas, beans, and lentils.  Milk and yogurt.  Fruit and juice.  Desserts, such as cakes, cookies, ice cream, and candy. How does alcohol affect me? Alcohol can cause a sudden decrease in blood glucose (hypoglycemia), especially if you use insulin or take certain oral diabetes medicines. Hypoglycemia can be a life-threatening condition. Symptoms of hypoglycemia (sleepiness, dizziness, and confusion) are similar to symptoms of having too much alcohol. If your health care provider says that alcohol is safe for you, follow these guidelines:  Limit alcohol intake to no more than 1 drink per day for nonpregnant women and 2 drinks per day for men. One drink equals 12 oz of beer, 5 oz of wine, or 1 oz of hard liquor.  Do not drink on an empty stomach.  Keep yourself hydrated with water, diet soda, or unsweetened iced tea.  Keep in mind that regular soda, juice, and other mixers may contain a lot of sugar and must be counted as carbs. What are tips for following this  plan?  Reading food labels  Start by checking the serving size on the "Nutrition Facts" label of packaged foods and drinks. The amount of calories, carbs, fats, and other nutrients listed on the label is based on one serving of the item. Many items contain more than one serving per package.  Check the total grams (g) of carbs in one serving. You can calculate the number of servings of carbs in one serving  by dividing the total carbs by 15. For example, if a food has 30 g of total carbs, it would be equal to 2 servings of carbs.  Check the number of grams (g) of saturated and trans fats in one serving. Choose foods that have low or no amount of these fats.  Check the number of milligrams (mg) of salt (sodium) in one serving. Most people should limit total sodium intake to less than 2,300 mg per day.  Always check the nutrition information of foods labeled as "low-fat" or "nonfat". These foods may be higher in added sugar or refined carbs and should be avoided.  Talk to your dietitian to identify your daily goals for nutrients listed on the label. Shopping  Avoid buying canned, premade, or processed foods. These foods tend to be high in fat, sodium, and added sugar.  Shop around the outside edge of the grocery store. This includes fresh fruits and vegetables, bulk grains, fresh meats, and fresh dairy. Cooking  Use low-heat cooking methods, such as baking, instead of high-heat cooking methods like deep frying.  Cook using healthy oils, such as olive, canola, or sunflower oil.  Avoid cooking with butter, cream, or high-fat meats. Meal planning  Eat meals and snacks regularly, preferably at the same times every day. Avoid going long periods of time without eating.  Eat foods high in fiber, such as fresh fruits, vegetables, beans, and whole grains. Talk to your dietitian about how many servings of carbs you can eat at each meal.  Eat 4-6 ounces (oz) of lean protein each day, such as lean  meat, chicken, fish, eggs, or tofu. One oz of lean protein is equal to: ? 1 oz of meat, chicken, or fish. ? 1 egg. ?  cup of tofu.  Eat some foods each day that contain healthy fats, such as avocado, nuts, seeds, and fish. Lifestyle  Check your blood glucose regularly.  Exercise regularly as told by your health care provider. This may include: ? 150 minutes of moderate-intensity or vigorous-intensity exercise each week. This could be brisk walking, biking, or water aerobics. ? Stretching and doing strength exercises, such as yoga or weightlifting, at least 2 times a week.  Take medicines as told by your health care provider.  Do not use any products that contain nicotine or tobacco, such as cigarettes and e-cigarettes. If you need help quitting, ask your health care provider.  Work with a Veterinary surgeon or diabetes educator to identify strategies to manage stress and any emotional and social challenges. Questions to ask a health care provider  Do I need to meet with a diabetes educator?  Do I need to meet with a dietitian?  What number can I call if I have questions?  When are the best times to check my blood glucose? Where to find more information:  American Diabetes Association: diabetes.org  Academy of Nutrition and Dietetics: www.eatright.[employer] of Diabetes and Digestive and Kidney Diseases (NIH): CarFlippers.tn Summary  A healthy meal plan will help you control your blood glucose and maintain a healthy lifestyle.  Working with a diet and nutrition specialist (dietitian) can help you make a meal plan that is best for you.  Keep in mind that carbohydrates (carbs) and alcohol have immediate effects on your blood glucose levels. It is important to count carbs and to use alcohol carefully. This information is not intended to replace advice given to you by your health care provider. Make sure you discuss any questions you have  with your health care  provider. Document Revised: 10/29/2017 Document Reviewed: 12/21/2016 Elsevier Patient Education  2020 Elsevier Inc.      Edwina Barth, MD [facility]

## 2020-04-01 NOTE — Assessment & Plan Note (Signed)
Elevated blood pressure.  Continue amlodipine 10 mg daily.  Increase lisinopril to 20 mg daily.  Follow-up in 6 months. Hemoglobin A1c better than before at 7.3.  Continue Metformin 1000 mg twice a day.  I agree with stopping glipizide.  Start Januvia 50 mg daily. Follow-up in 6 months.

## 2020-04-01 NOTE — Patient Instructions (Addendum)
   If you have lab work done today you will be contacted with your lab results within the next 2 weeks.  If you have not heard from us then please contact us. The fastest way to get your results is to register for My Chart.   IF you received an x-ray today, you will receive an invoice from [facility]. Please contact  Radiology at [phone] with questions or concerns regarding your invoice.   IF you received labwork today, you will receive an invoice from [facility]. Please contact [facility] at [phone] with questions or concerns regarding your invoice.   Our billing staff will not be able to assist you with questions regarding bills from these companies.  You will be contacted with the lab results as soon as they are available. The fastest way to get your results is to activate your My Chart account. Instructions are located on the last page of this paperwork. If you have not heard from us regarding the results in 2 weeks, please contact this office.      Hypertension, Adult High blood pressure (hypertension) is when the force of blood pumping through the arteries is too strong. The arteries are the blood vessels that carry blood from the heart throughout the body. Hypertension forces the heart to work harder to pump blood and may cause arteries to become narrow or stiff. Untreated or uncontrolled hypertension can cause a heart attack, heart failure, a stroke, kidney disease, and other problems. A blood pressure reading consists of a higher number over a lower number. Ideally, your blood pressure should be below 120/80. The first ("top") number is called the systolic pressure. It is a measure of the pressure in your arteries as your heart beats. The second ("bottom") number is called the diastolic pressure. It is a measure of the pressure in your arteries as the heart relaxes. What are the causes? The exact cause of this condition is not known. There are some conditions  that result in or are related to high blood pressure. What increases the risk? Some risk factors for high blood pressure are under your control. The following factors may make you more likely to develop this condition:  Smoking.  Having type 2 diabetes mellitus, high cholesterol, or both.  Not getting enough exercise or physical activity.  Being overweight.  Having too much fat, sugar, calories, or salt (sodium) in your diet.  Drinking too much alcohol. Some risk factors for high blood pressure may be difficult or impossible to change. Some of these factors include:  Having chronic kidney disease.  Having a family history of high blood pressure.  Age. Risk increases with age.  Race. You may be at higher risk if you are African American.  Gender. Men are at higher risk than women before age 45. After age 65, women are at higher risk than men.  Having obstructive sleep apnea.  Stress. What are the signs or symptoms? High blood pressure may not cause symptoms. Very high blood pressure (hypertensive crisis) may cause:  Headache.  Anxiety.  Shortness of breath.  Nosebleed.  Nausea and vomiting.  Vision changes.  Severe chest pain.  Seizures. How is this diagnosed? This condition is diagnosed by measuring your blood pressure while you are seated, with your arm resting on a flat surface, your legs uncrossed, and your feet flat on the floor. The cuff of the blood pressure monitor will be placed directly against the skin of your upper arm at the level of your   heart. It should be measured at least twice using the same arm. Certain conditions can cause a difference in blood pressure between your right and left arms. Certain factors can cause blood pressure readings to be lower or higher than normal for a short period of time:  When your blood pressure is higher when you are in a health care provider's office than when you are at home, this is called white coat hypertension.  Most people with this condition do not need medicines.  When your blood pressure is higher at home than when you are in a health care provider's office, this is called masked hypertension. Most people with this condition may need medicines to control blood pressure. If you have a high blood pressure reading during one visit or you have normal blood pressure with other risk factors, you may be asked to:  Return on a different day to have your blood pressure checked again.  Monitor your blood pressure at home for 1 week or longer. If you are diagnosed with hypertension, you may have other blood or imaging tests to help your health care provider understand your overall risk for other conditions. How is this treated? This condition is treated by making healthy lifestyle changes, such as eating healthy foods, exercising more, and reducing your alcohol intake. Your health care provider may prescribe medicine if lifestyle changes are not enough to get your blood pressure under control, and if:  Your systolic blood pressure is above 130.  Your diastolic blood pressure is above 80. Your personal target blood pressure may vary depending on your medical conditions, your age, and other factors. Follow these instructions at home: Eating and drinking   Eat a diet that is high in fiber and potassium, and low in sodium, added sugar, and fat. An example eating plan is called the DASH (Dietary Approaches to Stop Hypertension) diet. To eat this way: ? Eat plenty of fresh fruits and vegetables. Try to fill one half of your plate at each meal with fruits and vegetables. ? Eat whole grains, such as whole-wheat pasta, brown rice, or whole-grain bread. Fill about one fourth of your plate with whole grains. ? Eat or drink low-fat dairy products, such as skim milk or low-fat yogurt. ? Avoid fatty cuts of meat, processed or cured meats, and poultry with skin. Fill about one fourth of your plate with lean proteins, such  as fish, chicken without skin, beans, eggs, or tofu. ? Avoid pre-made and processed foods. These tend to be higher in sodium, added sugar, and fat.  Reduce your daily sodium intake. Most people with hypertension should eat less than 1,500 mg of sodium a day.  Do not drink alcohol if: ? Your health care provider tells you not to drink. ? You are pregnant, may be pregnant, or are planning to become pregnant.  If you drink alcohol: ? Limit how much you use to:  0-1 drink a day for women.  0-2 drinks a day for men. ? Be aware of how much alcohol is in your drink. In the [place] one drink equals one 12 oz bottle of beer (355 mL), one 5 oz glass of wine (148 mL), or one 1 oz glass of hard liquor (44 mL). Lifestyle   Work with your health care provider to maintain a healthy body weight or to lose weight. Ask what an ideal weight is for you.  Get at least 30 minutes of exercise most days of the week. Activities may include walking, swimming,   or biking.  Include exercise to strengthen your muscles (resistance exercise), such as Pilates or lifting weights, as part of your weekly exercise routine. Try to do these types of exercises for 30 minutes at least 3 days a week.  Do not use any products that contain nicotine or tobacco, such as cigarettes, e-cigarettes, and chewing tobacco. If you need help quitting, ask your health care provider.  Monitor your blood pressure at home as told by your health care provider.  Keep all follow-up visits as told by your health care provider. This is important. Medicines  Take over-the-counter and prescription medicines only as told by your health care provider. Follow directions carefully. Blood pressure medicines must be taken as prescribed.  Do not skip doses of blood pressure medicine. Doing this puts you at risk for problems and can make the medicine less effective.  Ask your health care provider about side effects or reactions to medicines that you  should watch for. Contact a health care provider if you:  Think you are having a reaction to a medicine you are taking.  Have headaches that keep coming back (recurring).  Feel dizzy.  Have swelling in your ankles.  Have trouble with your vision. Get help right away if you:  Develop a severe headache or confusion.  Have unusual weakness or numbness.  Feel faint.  Have severe pain in your chest or abdomen.  Vomit repeatedly.  Have trouble breathing. Summary  Hypertension is when the force of blood pumping through your arteries is too strong. If this condition is not controlled, it may put you at risk for serious complications.  Your personal target blood pressure may vary depending on your medical conditions, your age, and other factors. For most people, a normal blood pressure is less than 120/80.  Hypertension is treated with lifestyle changes, medicines, or a combination of both. Lifestyle changes include losing weight, eating a healthy, low-sodium diet, exercising more, and limiting alcohol. This information is not intended to replace advice given to you by your health care provider. Make sure you discuss any questions you have with your health care provider. Document Revised: 07/27/2018 Document Reviewed: 07/27/2018 Elsevier Patient Education  2020 Elsevier Inc.  Diabetes Mellitus and Nutrition, Adult When you have diabetes (diabetes mellitus), it is very important to have healthy eating habits because your blood sugar (glucose) levels are greatly affected by what you eat and drink. Eating healthy foods in the appropriate amounts, at about the same times every day, can help you:  Control your blood glucose.  Lower your risk of heart disease.  Improve your blood pressure.  Reach or maintain a healthy weight. Every person with diabetes is different, and each person has different needs for a meal plan. Your health care provider may recommend that you work with a diet  and nutrition specialist (dietitian) to make a meal plan that is best for you. Your meal plan may vary depending on factors such as:  The calories you need.  The medicines you take.  Your weight.  Your blood glucose, blood pressure, and cholesterol levels.  Your activity level.  Other health conditions you have, such as heart or kidney disease. How do carbohydrates affect me? Carbohydrates, also called carbs, affect your blood glucose level more than any other type of food. Eating carbs naturally raises the amount of glucose in your blood. Carb counting is a method for keeping track of how many carbs you eat. Counting carbs is important to keep your blood glucose   at a healthy level, especially if you use insulin or take certain oral diabetes medicines. It is important to know how many carbs you can safely have in each meal. This is different for every person. Your dietitian can help you calculate how many carbs you should have at each meal and for each snack. Foods that contain carbs include:  Bread, cereal, rice, pasta, and crackers.  Potatoes and corn.  Peas, beans, and lentils.  Milk and yogurt.  Fruit and juice.  Desserts, such as cakes, cookies, ice cream, and candy. How does alcohol affect me? Alcohol can cause a sudden decrease in blood glucose (hypoglycemia), especially if you use insulin or take certain oral diabetes medicines. Hypoglycemia can be a life-threatening condition. Symptoms of hypoglycemia (sleepiness, dizziness, and confusion) are similar to symptoms of having too much alcohol. If your health care provider says that alcohol is safe for you, follow these guidelines:  Limit alcohol intake to no more than 1 drink per day for nonpregnant women and 2 drinks per day for men. One drink equals 12 oz of beer, 5 oz of wine, or 1 oz of hard liquor.  Do not drink on an empty stomach.  Keep yourself hydrated with water, diet soda, or unsweetened iced tea.  Keep in  mind that regular soda, juice, and other mixers may contain a lot of sugar and must be counted as carbs. What are tips for following this plan?  Reading food labels  Start by checking the serving size on the "Nutrition Facts" label of packaged foods and drinks. The amount of calories, carbs, fats, and other nutrients listed on the label is based on one serving of the item. Many items contain more than one serving per package.  Check the total grams (g) of carbs in one serving. You can calculate the number of servings of carbs in one serving by dividing the total carbs by 15. For example, if a food has 30 g of total carbs, it would be equal to 2 servings of carbs.  Check the number of grams (g) of saturated and trans fats in one serving. Choose foods that have low or no amount of these fats.  Check the number of milligrams (mg) of salt (sodium) in one serving. Most people should limit total sodium intake to less than 2,300 mg per day.  Always check the nutrition information of foods labeled as "low-fat" or "nonfat". These foods may be higher in added sugar or refined carbs and should be avoided.  Talk to your dietitian to identify your daily goals for nutrients listed on the label. Shopping  Avoid buying canned, premade, or processed foods. These foods tend to be high in fat, sodium, and added sugar.  Shop around the outside edge of the grocery store. This includes fresh fruits and vegetables, bulk grains, fresh meats, and fresh dairy. Cooking  Use low-heat cooking methods, such as baking, instead of high-heat cooking methods like deep frying.  Cook using healthy oils, such as olive, canola, or sunflower oil.  Avoid cooking with butter, cream, or high-fat meats. Meal planning  Eat meals and snacks regularly, preferably at the same times every day. Avoid going long periods of time without eating.  Eat foods high in fiber, such as fresh fruits, vegetables, beans, and whole grains. Talk  to your dietitian about how many servings of carbs you can eat at each meal.  Eat 4-6 ounces (oz) of lean protein each day, such as lean meat, chicken, fish, eggs, or   tofu. One oz of lean protein is equal to: ? 1 oz of meat, chicken, or fish. ? 1 egg. ?  cup of tofu.  Eat some foods each day that contain healthy fats, such as avocado, nuts, seeds, and fish. Lifestyle  Check your blood glucose regularly.  Exercise regularly as told by your health care provider. This may include: ? 150 minutes of moderate-intensity or vigorous-intensity exercise each week. This could be brisk walking, biking, or water aerobics. ? Stretching and doing strength exercises, such as yoga or weightlifting, at least 2 times a week.  Take medicines as told by your health care provider.  Do not use any products that contain nicotine or tobacco, such as cigarettes and e-cigarettes. If you need help quitting, ask your health care provider.  Work with a counselor or diabetes educator to identify strategies to manage stress and any emotional and social challenges. Questions to ask a health care provider  Do I need to meet with a diabetes educator?  Do I need to meet with a dietitian?  What number can I call if I have questions?  When are the best times to check my blood glucose? Where to find more information:  American Diabetes Association: diabetes.org  Academy of Nutrition and Dietetics: www.eatright.org  National Institute of Diabetes and Digestive and Kidney Diseases (NIH): www.niddk.nih.gov Summary  A healthy meal plan will help you control your blood glucose and maintain a healthy lifestyle.  Working with a diet and nutrition specialist (dietitian) can help you make a meal plan that is best for you.  Keep in mind that carbohydrates (carbs) and alcohol have immediate effects on your blood glucose levels. It is important to count carbs and to use alcohol carefully. This information is not intended  to replace advice given to you by your health care provider. Make sure you discuss any questions you have with your health care provider. Document Revised: 10/29/2017 Document Reviewed: 12/21/2016 Elsevier Patient Education  2020 Elsevier Inc.  

## 2020-04-02 LAB — COMPREHENSIVE METABOLIC PANEL
ALT: 29 IU/L (ref 0–44)
AST: 22 IU/L (ref 0–40)
Albumin/Globulin Ratio: 1.4 (ref 1.2–2.2)
Albumin: 4.2 g/dL (ref 4.0–5.0)
Alkaline Phosphatase: 76 IU/L (ref 39–117)
BUN/Creatinine Ratio: 13 (ref 9–20)
BUN: 9 mg/dL (ref 6–24)
Bilirubin Total: 0.2 mg/dL (ref 0.0–1.2)
CO2: 24 mmol/L (ref 20–29)
Calcium: 9.1 mg/dL (ref 8.7–10.2)
Chloride: 103 mmol/L (ref 96–106)
Creatinine, Ser: 0.67 mg/dL — ABNORMAL LOW (ref 0.76–1.27)
GFR calc Af Amer: 135 mL/min/{1.73_m2} (ref >59)
GFR calc non Af Amer: 117 mL/min/{1.73_m2} (ref >59)
Globulin, Total: 3 g/dL (ref 1.5–4.5)
Glucose: 178 mg/dL — ABNORMAL HIGH (ref 65–99)
Potassium: 3.7 mmol/L (ref 3.5–5.2)
Sodium: 141 mmol/L (ref 134–144)
Total Protein: 7.2 g/dL (ref 6.0–8.5)

## 2020-04-02 LAB — CBC WITH DIFFERENTIAL/PLATELET
Basophils Absolute: 0 10*3/uL (ref 0.0–0.2)
Basos: 0 %
EOS (ABSOLUTE): 0.2 10*3/uL (ref 0.0–0.4)
Eos: 2 %
Hematocrit: 43.1 % (ref 37.5–51.0)
Hemoglobin: 14.5 g/dL (ref 13.0–17.7)
Immature Grans (Abs): 0 10*3/uL (ref 0.0–0.1)
Immature Granulocytes: 0 %
Lymphocytes Absolute: 2.9 10*3/uL (ref 0.7–3.1)
Lymphs: 32 %
MCH: 29.4 pg (ref 26.6–33.0)
MCHC: 33.6 g/dL (ref 31.5–35.7)
MCV: 87 fL (ref 79–97)
Monocytes Absolute: 0.6 10*3/uL (ref 0.1–0.9)
Monocytes: 7 %
Neutrophils Absolute: 5.4 10*3/uL (ref 1.4–7.0)
Neutrophils: 59 %
Platelets: 288 10*3/uL (ref 150–450)
RBC: 4.94 x10E6/uL (ref 4.14–5.80)
RDW: 14.5 % (ref 11.6–15.4)
WBC: 9.1 10*3/uL (ref 3.4–10.8)

## 2020-04-02 LAB — LIPID PANEL
Chol/HDL Ratio: 5.2 ratio — ABNORMAL HIGH (ref 0.0–5.0)
Cholesterol, Total: 188 mg/dL (ref 100–199)
HDL: 36 mg/dL — ABNORMAL LOW (ref >39)
LDL Chol Calc (NIH): 97 mg/dL (ref 0–99)
Triglycerides: 326 mg/dL — ABNORMAL HIGH (ref 0–149)
VLDL Cholesterol Cal: 55 mg/dL — ABNORMAL HIGH (ref 5–40)

## 2020-08-02 ENCOUNTER — Encounter: Payer: Self-pay | Admitting: Emergency Medicine

## 2020-10-14 ENCOUNTER — Ambulatory Visit: Payer: BC Managed Care – PPO | Admitting: Emergency Medicine

## 2021-01-18 ENCOUNTER — Other Ambulatory Visit: Payer: Self-pay | Admitting: Emergency Medicine

## 2021-01-18 DIAGNOSIS — I152 Hypertension secondary to endocrine disorders: Secondary | ICD-10-CM

## 2021-01-18 DIAGNOSIS — E1159 Type 2 diabetes mellitus with other circulatory complications: Secondary | ICD-10-CM

## 2021-01-18 NOTE — Telephone Encounter (Signed)
Requested medication (s) are due for refill today: Yes  Requested medication (s) are on the active medication list: Yes  Last refill:  12/07/19  Future visit scheduled: No  Notes to clinic:  Unable to refill per protocol, Rx expired     Requested Prescriptions  Pending Prescriptions Disp Refills   amLODipine (NORVASC) 10 MG tablet [Pharmacy Med Name: AMLODIPINE BESYLATE 10MG  TABLETS] 90 tablet 3    Sig: TAKE 1 TABLET(10 MG) BY MOUTH DAILY      Cardiovascular:  Calcium Channel Blockers Failed - 01/18/2021 11:26 AM      Failed - Last BP in normal range    BP Readings from Last 1 Encounters:  04/01/20 (!) 150/95          Failed - Valid encounter within last 6 months    Recent Outpatient Visits           9 months ago Hypertension associated with type 2 diabetes mellitus (HCC)   [facility] [place], [place], [place]   1 year ago Hypertension associated with type 2 diabetes mellitus [facility])   [facility] [place], [place], [place]   1 year ago Type 2 diabetes mellitus without complication, without long-term current use of insulin [facility])   [facility] at [facility], [place], [place]   2 years ago Type 2 diabetes mellitus without complication, without long-term current use of insulin [facility])   [facility] at [facility], [facility], MD   2 years ago Flu-like symptoms   [facility] at [facility], [facility], MD

## 2021-01-18 NOTE — Telephone Encounter (Addendum)
MyChart message sent to call to schedule an appointment.

## 2021-01-19 ENCOUNTER — Encounter: Payer: Self-pay | Admitting: Emergency Medicine

## 2021-01-20 NOTE — Telephone Encounter (Signed)
RX sent to pharmacy. Pt has upcoming appt in March

## 2021-01-29 ENCOUNTER — Ambulatory Visit: Payer: BC Managed Care – PPO | Admitting: Emergency Medicine

## 2021-02-03 ENCOUNTER — Ambulatory Visit: Payer: BC Managed Care – PPO | Admitting: Emergency Medicine

## 2021-02-03 ENCOUNTER — Encounter: Payer: Self-pay | Admitting: Emergency Medicine

## 2021-02-03 ENCOUNTER — Other Ambulatory Visit: Payer: Self-pay

## 2021-02-03 VITALS — BP 136/84 | HR 88 | Temp 98.2°F | Resp 17 | Ht 75.0 in | Wt 270.4 lb

## 2021-02-03 DIAGNOSIS — E1159 Type 2 diabetes mellitus with other circulatory complications: Secondary | ICD-10-CM | POA: Diagnosis not present

## 2021-02-03 DIAGNOSIS — I152 Hypertension secondary to endocrine disorders: Secondary | ICD-10-CM | POA: Diagnosis not present

## 2021-02-03 DIAGNOSIS — G4733 Obstructive sleep apnea (adult) (pediatric): Secondary | ICD-10-CM

## 2021-02-03 DIAGNOSIS — Z9989 Dependence on other enabling machines and devices: Secondary | ICD-10-CM | POA: Diagnosis not present

## 2021-02-03 LAB — POCT GLYCOSYLATED HEMOGLOBIN (HGB A1C): Hemoglobin A1C: 8.5 % — AB (ref 4.0–5.6)

## 2021-02-03 LAB — GLUCOSE, POCT (MANUAL RESULT ENTRY): POC Glucose: 169 mg/dl — AB (ref 70–99)

## 2021-02-03 MED ORDER — METFORMIN HCL 500 MG PO TABS: 1000.0000 mg | ORAL_TABLET | Freq: Two times a day (BID) | ORAL | 3 refills | Status: DC

## 2021-02-03 MED ORDER — GLIPIZIDE 5 MG PO TABS: 5.0000 mg | ORAL_TABLET | Freq: Two times a day (BID) | ORAL | 1 refills | Status: DC

## 2021-02-03 NOTE — Assessment & Plan Note (Signed)
Well-controlled hypertension.  Continue amlodipine 10 mg daily. Uncontrolled diabetes with hemoglobin A1c at 8.5.  Continue Metformin 1000 mg twice a day start glipizide 5 mg twice a day.  Diet and nutrition discussed. Continue rosuvastatin 20 mg daily. Follow-up in 3 months.

## 2021-02-03 NOTE — Patient Instructions (Addendum)
   If you have lab work done today you will be contacted with your lab results within the next 2 weeks.  If you have not heard from us then please contact us. The fastest way to get your results is to register for My Chart.   IF you received an x-ray today, you will receive an invoice from [facility]. Please contact  Radiology at [phone] with questions or concerns regarding your invoice.   IF you received labwork today, you will receive an invoice from [facility]. Please contact [facility] at [phone] with questions or concerns regarding your invoice.   Our billing staff will not be able to assist you with questions regarding bills from these companies.  You will be contacted with the lab results as soon as they are available. The fastest way to get your results is to activate your My Chart account. Instructions are located on the last page of this paperwork. If you have not heard from us regarding the results in 2 weeks, please contact this office.     Diabetes Mellitus and Nutrition, Adult When you have diabetes, or diabetes mellitus, it is very important to have healthy eating habits because your blood sugar (glucose) levels are greatly affected by what you eat and drink. Eating healthy foods in the right amounts, at about the same times every day, can help you:  Control your blood glucose.  Lower your risk of heart disease.  Improve your blood pressure.  Reach or maintain a healthy weight. What can affect my meal plan? Every person with diabetes is different, and each person has different needs for a meal plan. Your health care provider may recommend that you work with a dietitian to make a meal plan that is best for you. Your meal plan may vary depending on factors such as:  The calories you need.  The medicines you take.  Your weight.  Your blood glucose, blood pressure, and cholesterol levels.  Your activity level.  Other health conditions you  have, such as heart or kidney disease. How do carbohydrates affect me? Carbohydrates, also called carbs, affect your blood glucose level more than any other type of food. Eating carbs naturally raises the amount of glucose in your blood. Carb counting is a method for keeping track of how many carbs you eat. Counting carbs is important to keep your blood glucose at a healthy level, especially if you use insulin or take certain oral diabetes medicines. It is important to know how many carbs you can safely have in each meal. This is different for every person. Your dietitian can help you calculate how many carbs you should have at each meal and for each snack. How does alcohol affect me? Alcohol can cause a sudden decrease in blood glucose (hypoglycemia), especially if you use insulin or take certain oral diabetes medicines. Hypoglycemia can be a life-threatening condition. Symptoms of hypoglycemia, such as sleepiness, dizziness, and confusion, are similar to symptoms of having too much alcohol.  Do not drink alcohol if: ? Your health care provider tells you not to drink. ? You are pregnant, may be pregnant, or are planning to become pregnant.  If you drink alcohol: ? Do not drink on an empty stomach. ? Limit how much you use to:  0-1 drink a day for women.  0-2 drinks a day for men. ? Be aware of how much alcohol is in your drink. In the [place] one drink equals one 12 oz bottle of beer (355 mL),   one 5 oz glass of wine (148 mL), or one 1 oz glass of hard liquor (44 mL). ? Keep yourself hydrated with water, diet soda, or unsweetened iced tea.  Keep in mind that regular soda, juice, and other mixers may contain a lot of sugar and must be counted as carbs. What are tips for following this plan? Reading food labels  Start by checking the serving size on the "Nutrition Facts" label of packaged foods and drinks. The amount of calories, carbs, fats, and other nutrients listed on the label is based on  one serving of the item. Many items contain more than one serving per package.  Check the total grams (g) of carbs in one serving. You can calculate the number of servings of carbs in one serving by dividing the total carbs by 15. For example, if a food has 30 g of total carbs per serving, it would be equal to 2 servings of carbs.  Check the number of grams (g) of saturated fats and trans fats in one serving. Choose foods that have a low amount or none of these fats.  Check the number of milligrams (mg) of salt (sodium) in one serving. Most people should limit total sodium intake to less than 2,300 mg per day.  Always check the nutrition information of foods labeled as "low-fat" or "nonfat." These foods may be higher in added sugar or refined carbs and should be avoided.  Talk to your dietitian to identify your daily goals for nutrients listed on the label. Shopping  Avoid buying canned, pre-made, or processed foods. These foods tend to be high in fat, sodium, and added sugar.  Shop around the outside edge of the grocery store. This is where you will most often find fresh fruits and vegetables, bulk grains, fresh meats, and fresh dairy. Cooking  Use low-heat cooking methods, such as baking, instead of high-heat cooking methods like deep frying.  Cook using healthy oils, such as olive, canola, or sunflower oil.  Avoid cooking with butter, cream, or high-fat meats. Meal planning  Eat meals and snacks regularly, preferably at the same times every day. Avoid going long periods of time without eating.  Eat foods that are high in fiber, such as fresh fruits, vegetables, beans, and whole grains. Talk with your dietitian about how many servings of carbs you can eat at each meal.  Eat 4-6 oz (112-168 g) of lean protein each day, such as lean meat, chicken, fish, eggs, or tofu. One ounce (oz) of lean protein is equal to: ? 1 oz (28 g) of meat, chicken, or fish. ? 1 egg. ?  cup (62 g) of  tofu.  Eat some foods each day that contain healthy fats, such as avocado, nuts, seeds, and fish.   What foods should I eat? Fruits Berries. Apples. Oranges. Peaches. Apricots. Plums. Grapes. Mango. Papaya. Pomegranate. Kiwi. Cherries. Vegetables Lettuce. Spinach. Leafy greens, including kale, chard, collard greens, and mustard greens. Beets. Cauliflower. Cabbage. Broccoli. Carrots. Green beans. Tomatoes. Peppers. Onions. Cucumbers. Brussels sprouts. Grains Whole grains, such as whole-wheat or whole-grain bread, crackers, tortillas, cereal, and pasta. Unsweetened oatmeal. Quinoa. Brown or wild rice. Meats and other proteins Seafood. Poultry without skin. Lean cuts of poultry and beef. Tofu. Nuts. Seeds. Dairy Low-fat or fat-free dairy products such as milk, yogurt, and cheese. The items listed above may not be a complete list of foods and beverages you can eat. Contact a dietitian for more information. What foods should I avoid? Fruits Fruits canned   with syrup. Vegetables Canned vegetables. Frozen vegetables with butter or cream sauce. Grains Refined white flour and flour products such as bread, pasta, snack foods, and cereals. Avoid all processed foods. Meats and other proteins Fatty cuts of meat. Poultry with skin. Breaded or fried meats. Processed meat. Avoid saturated fats. Dairy Full-fat yogurt, cheese, or milk. Beverages Sweetened drinks, such as soda or iced tea. The items listed above may not be a complete list of foods and beverages you should avoid. Contact a dietitian for more information. Questions to ask a health care provider  Do I need to meet with a diabetes educator?  Do I need to meet with a dietitian?  What number can I call if I have questions?  When are the best times to check my blood glucose? Where to find more information:  American Diabetes Association: diabetes.org  Academy of Nutrition and Dietetics: www.eatright.org  National Institute of  Diabetes and Digestive and Kidney Diseases: www.niddk.nih.gov  Association of Diabetes Care and Education Specialists: www.diabeteseducator.org Summary  It is important to have healthy eating habits because your blood sugar (glucose) levels are greatly affected by what you eat and drink.  A healthy meal plan will help you control your blood glucose and maintain a healthy lifestyle.  Your health care provider may recommend that you work with a dietitian to make a meal plan that is best for you.  Keep in mind that carbohydrates (carbs) and alcohol have immediate effects on your blood glucose levels. It is important to count carbs and to use alcohol carefully. This information is not intended to replace advice given to you by your health care provider. Make sure you discuss any questions you have with your health care provider. Document Revised: 10/24/2019 Document Reviewed: 10/24/2019 [employer] Patient Education  2021 [employer]  

## 2021-02-03 NOTE — Progress Notes (Signed)
Kevin Roy 44 y.o.   Chief Complaint  Patient presents with  . Hypertension    Pt following up HTN doing okay pt denies physical symptoms  . Diabetes    Pt doing okay no concerns needs recheck doing lab work today     HISTORY OF PRESENT ILLNESS: This is a 45 y.o. male with history of diabetes and hypertension here for follow-up. #1 hypertension: On amlodipine 10 mg daily.  Not taking lisinopril. #2 diabetes: On Metformin 1000 mg 2 times daily.  Not taking glipizide. #3 dyslipidemia: On rosuvastatin 20 mg daily. No other complaints or medical concerns today.  HPI   Prior to Admission medications   Medication Sig Start Date End Date Taking? Authorizing Provider  amLODipine (NORVASC) 10 MG tablet TAKE 1 TABLET(10 MG) BY MOUTH DAILY 01/20/21  Yes Ziv Welchel, Eilleen Kempf, MD  aspirin (SB LOW DOSE ASA EC) 81 MG EC tablet Take 1 tablet (81 mg total) by mouth daily. Swallow whole. 02/16/18  Yes Ofilia Neas, PA-C  lisinopril (ZESTRIL) 20 MG tablet Take 1 tablet (20 mg total) by mouth daily. 04/01/20  Yes SagardiaEilleen Kempf, MD  Multiple Vitamin (MULTI-VITAMIN PO) Take by mouth daily.   Yes [provider]  rosuvastatin (CRESTOR) 20 MG tablet TAKE 1 TABLET(20 MG) BY MOUTH DAILY 12/07/19  Yes Maribell Demeo, Eilleen Kempf, MD  sildenafil (VIAGRA) 100 MG tablet Take 0.5-1 tablets (50-100 mg total) by mouth daily as needed for erectile dysfunction. 01/08/20  Yes Geraldine Tesar, Eilleen Kempf, MD  glipiZIDE (GLUCOTROL) 5 MG tablet Take 1 tablet (5 mg total) by mouth 2 (two) times daily before a meal. 12/07/19 03/06/20  Rosela Supak, Eilleen Kempf, MD  metFORMIN (GLUCOPHAGE) 500 MG tablet Take 2 tablets (1,000 mg total) by mouth 2 (two) times daily with a meal. 12/07/19 03/06/20  Khyle Goodell, Eilleen Kempf, MD  sitaGLIPtin (JANUVIA) 50 MG tablet Take 1 tablet (50 mg total) by mouth daily. 04/01/20 06/30/20  Georgina Quint, MD    No Known Allergies  Patient Active Problem List   Diagnosis Date Noted  . Hypertension  associated with type 2 diabetes mellitus (HCC) 12/14/2018  . Smoker 11/04/2018  . Type 2 diabetes mellitus without complication, without long-term current use of insulin (HCC) 11/04/2018  . Obstructive sleep apnea treated with continuous positive airway pressure (CPAP) 02/16/2018  . OSA (obstructive sleep apnea) 09/29/2017    Past Medical History:  Diagnosis Date  . Hypertension   . OSA on CPAP     History reviewed. No pertinent surgical history.  Social History   Socioeconomic History  . Marital status: Single    Spouse name: Not on file  . Number of children: Not on file  . Years of education: Not on file  . Highest education level: Not on file  Occupational History  . Not on file  Tobacco Use  . Smoking status: Current Every Day Smoker    Packs/day: 0.50    Years: 25.00    Pack years: 12.50    Types: Cigarettes  . Smokeless tobacco: Never Used  . Tobacco comment: 02/16/18 1/2 PPD  Substance and Sexual Activity  . Alcohol use: Yes    Alcohol/week: 1.0 standard drink    Types: 1 Cans of beer per week    Comment: OCCASSIONALY  . Drug use: No  . Sexual activity: Not on file  Other Topics Concern  . Not on file  Social History Narrative  . Not on file   Social Determinants of Health   Financial  Resource Strain: Not on file  Food Insecurity: Not on file  Transportation Needs: Not on file  Physical Activity: Not on file  Stress: Not on file  Social Connections: Not on file  Intimate Partner Violence: Not on file    Family History  Problem Relation Age of Onset  . Hypertension Mother   . Hyperlipidemia Mother   . Cancer Father   . Hyperlipidemia Maternal Grandmother      Review of Systems  Constitutional: Negative.  Negative for chills and fever.  HENT: Negative.  Negative for congestion and sore throat.   Respiratory: Negative.  Negative for cough and shortness of breath.   Cardiovascular: Negative.  Negative for chest pain and palpitations.   Gastrointestinal: Negative.  Negative for abdominal pain, blood in stool, diarrhea, melena, nausea and vomiting.  Genitourinary: Negative.  Negative for dysuria and hematuria.  Musculoskeletal: Negative.  Negative for back pain, myalgias and neck pain.  Skin: Negative.  Negative for rash.  Neurological: Negative.  Negative for dizziness and headaches.  All other systems reviewed and are negative.   Vitals:   02/03/21 1408 02/03/21 1413  BP: (!) 144/90 136/84  Pulse: 88   Resp: 17   Temp: 98.2 F (36.8 C)   SpO2: 99%    Wt Readings from Last 3 Encounters:  02/03/21 270 lb 6.4 oz (122.7 kg)  04/01/20 276 lb (125.2 kg)  12/07/19 279 lb (126.6 kg)   Results for orders placed or performed in visit on 02/03/21 (from the past 24 hour(s))  POCT glucose (manual entry)     Status: Abnormal   Collection Time: 02/03/21  2:14 PM  Result Value Ref Range   POC Glucose 169 (A) 70 - 99 mg/dl  POCT glycosylated hemoglobin (Hb A1C)     Status: Abnormal   Collection Time: 02/03/21  2:22 PM  Result Value Ref Range   Hemoglobin A1C 8.5 (A) 4.0 - 5.6 %   HbA1c POC (<> result, manual entry)     HbA1c, POC (prediabetic range)     HbA1c, POC (controlled diabetic range)      Physical Exam Vitals reviewed.  Constitutional:      Appearance: Normal appearance.  HENT:     Head: Normocephalic.  Eyes:     Extraocular Movements: Extraocular movements intact.     Conjunctiva/sclera: Conjunctivae normal.     Pupils: Pupils are equal, round, and reactive to light.  Cardiovascular:     Rate and Rhythm: Normal rate and regular rhythm.     Pulses: Normal pulses.     Heart sounds: Normal heart sounds.  Pulmonary:     Effort: Pulmonary effort is normal.     Breath sounds: Normal breath sounds.  Musculoskeletal:        General: Normal range of motion.     Cervical back: Normal range of motion and neck supple.  Skin:    General: Skin is warm and dry.     Capillary Refill: Capillary refill takes less  than 2 seconds.  Neurological:     General: No focal deficit present.     Mental Status: He is alert and oriented to person, place, and time.  Psychiatric:        Mood and Affect: Mood normal.        Behavior: Behavior normal.     Results for orders placed or performed in visit on 02/03/21 (from the past 24 hour(s))  POCT glucose (manual entry)     Status: Abnormal   Collection  Time: 02/03/21  2:14 PM  Result Value Ref Range   POC Glucose 169 (A) 70 - 99 mg/dl  POCT glycosylated hemoglobin (Hb A1C)     Status: Abnormal   Collection Time: 02/03/21  2:22 PM  Result Value Ref Range   Hemoglobin A1C 8.5 (A) 4.0 - 5.6 %   HbA1c POC (<> result, manual entry)     HbA1c, POC (prediabetic range)     HbA1c, POC (controlled diabetic range)      ASSESSMENT & PLAN: Hypertension associated with type 2 diabetes mellitus (HCC) Well-controlled hypertension.  Continue amlodipine 10 mg daily. Uncontrolled diabetes with hemoglobin A1c at 8.5.  Continue Metformin 1000 mg twice a day start glipizide 5 mg twice a day.  Diet and nutrition discussed. Continue rosuvastatin 20 mg daily. Follow-up in 3 months.  Jacquenette ShoneJulian was seen today for hypertension and diabetes.  Diagnoses and all orders for this visit:  Hypertension associated with type 2 diabetes mellitus (HCC) -     POCT glucose (manual entry) -     POCT glycosylated hemoglobin (Hb A1C) -     Comprehensive metabolic panel -     glipiZIDE (GLUCOTROL) 5 MG tablet; Take 1 tablet (5 mg total) by mouth 2 (two) times daily before a meal. -     metFORMIN (GLUCOPHAGE) 500 MG tablet; Take 2 tablets (1,000 mg total) by mouth 2 (two) times daily with a meal.  Obstructive sleep apnea treated with continuous positive airway pressure (CPAP)    Patient Instructions       If you have lab work done today you will be contacted with your lab results within the next 2 weeks.  If you have not heard from us then please contact us. The fastest way to get your  results is to register for My Chart.   IF you received an x-ray today, you will receive an invoice from [facility]. Please contact [facility] at [phone] with questions or concerns regarding your invoice.   IF you received labwork today, you will receive an invoice from [facility]. Please contact [facility] at [phone] with questions or concerns regarding your invoice.   Our billing staff will not be able to assist you with questions regarding bills from these companies.  You will be contacted with the lab results as soon as they are available. The fastest way to get your results is to activate your My Chart account. Instructions are located on the last page of this paperwork. If you have not heard from us regarding the results in 2 weeks, please contact this office.     Diabetes Mellitus and Nutrition, Adult When you have diabetes, or diabetes mellitus, it is very important to have healthy eating habits because your blood sugar (glucose) levels are greatly affected by what you eat and drink. Eating healthy foods in the right amounts, at about the same times every day, can help you:  Control your blood glucose.  Lower your risk of heart disease.  Improve your blood pressure.  Reach or maintain a healthy weight. What can affect my meal plan? Every person with diabetes is different, and each person has different needs for a meal plan. Your health care provider may recommend that you work with a dietitian to make a meal plan that is best for you. Your meal plan may vary depending on factors such as:  The calories you need.  The medicines you take.  Your weight.  Your blood glucose, blood pressure, and cholesterol levels.  Your  activity level.  Other health conditions you have, such as heart or kidney disease. How do carbohydrates affect me? Carbohydrates, also called carbs, affect your blood glucose level more than any other type of food. Eating carbs  naturally raises the amount of glucose in your blood. Carb counting is a method for keeping track of how many carbs you eat. Counting carbs is important to keep your blood glucose at a healthy level, especially if you use insulin or take certain oral diabetes medicines. It is important to know how many carbs you can safely have in each meal. This is different for every person. Your dietitian can help you calculate how many carbs you should have at each meal and for each snack. How does alcohol affect me? Alcohol can cause a sudden decrease in blood glucose (hypoglycemia), especially if you use insulin or take certain oral diabetes medicines. Hypoglycemia can be a life-threatening condition. Symptoms of hypoglycemia, such as sleepiness, dizziness, and confusion, are similar to symptoms of having too much alcohol.  Do not drink alcohol if: ? Your health care provider tells you not to drink. ? You are pregnant, may be pregnant, or are planning to become pregnant.  If you drink alcohol: ? Do not drink on an empty stomach. ? Limit how much you use to:  0-1 drink a day for women.  0-2 drinks a day for men. ? Be aware of how much alcohol is in your drink. In the [place] one drink equals one 12 oz bottle of beer (355 mL), one 5 oz glass of wine (148 mL), or one 1 oz glass of hard liquor (44 mL). ? Keep yourself hydrated with water, diet soda, or unsweetened iced tea.  Keep in mind that regular soda, juice, and other mixers may contain a lot of sugar and must be counted as carbs. What are tips for following this plan? Reading food labels  Start by checking the serving size on the "Nutrition Facts" label of packaged foods and drinks. The amount of calories, carbs, fats, and other nutrients listed on the label is based on one serving of the item. Many items contain more than one serving per package.  Check the total grams (g) of carbs in one serving. You can calculate the number of servings of carbs in  one serving by dividing the total carbs by 15. For example, if a food has 30 g of total carbs per serving, it would be equal to 2 servings of carbs.  Check the number of grams (g) of saturated fats and trans fats in one serving. Choose foods that have a low amount or none of these fats.  Check the number of milligrams (mg) of salt (sodium) in one serving. Most people should limit total sodium intake to less than 2,300 mg per day.  Always check the nutrition information of foods labeled as "low-fat" or "nonfat." These foods may be higher in added sugar or refined carbs and should be avoided.  Talk to your dietitian to identify your daily goals for nutrients listed on the label. Shopping  Avoid buying canned, pre-made, or processed foods. These foods tend to be high in fat, sodium, and added sugar.  Shop around the outside edge of the grocery store. This is where you will most often find fresh fruits and vegetables, bulk grains, fresh meats, and fresh dairy. Cooking  Use low-heat cooking methods, such as baking, instead of high-heat cooking methods like deep frying.  Cook using healthy oils, such as olive,  canola, or sunflower oil.  Avoid cooking with butter, cream, or high-fat meats. Meal planning  Eat meals and snacks regularly, preferably at the same times every day. Avoid going long periods of time without eating.  Eat foods that are high in fiber, such as fresh fruits, vegetables, beans, and whole grains. Talk with your dietitian about how many servings of carbs you can eat at each meal.  Eat 4-6 oz (112-168 g) of lean protein each day, such as lean meat, chicken, fish, eggs, or tofu. One ounce (oz) of lean protein is equal to: ? 1 oz (28 g) of meat, chicken, or fish. ? 1 egg. ?  cup (62 g) of tofu.  Eat some foods each day that contain healthy fats, such as avocado, nuts, seeds, and fish.   What foods should I eat? Fruits Berries. Apples. Oranges. Peaches. Apricots. Plums.  Grapes. Mango. Papaya. Pomegranate. Kiwi. Cherries. Vegetables Lettuce. Spinach. Leafy greens, including kale, chard, collard greens, and mustard greens. Beets. Cauliflower. Cabbage. Broccoli. Carrots. Green beans. Tomatoes. Peppers. Onions. Cucumbers. Brussels sprouts. Grains Whole grains, such as whole-wheat or whole-grain bread, crackers, tortillas, cereal, and pasta. Unsweetened oatmeal. Quinoa. Brown or wild rice. Meats and other proteins Seafood. Poultry without skin. Lean cuts of poultry and beef. Tofu. Nuts. Seeds. Dairy Low-fat or fat-free dairy products such as milk, yogurt, and cheese. The items listed above may not be a complete list of foods and beverages you can eat. Contact a dietitian for more information. What foods should I avoid? Fruits Fruits canned with syrup. Vegetables Canned vegetables. Frozen vegetables with butter or cream sauce. Grains Refined white flour and flour products such as bread, pasta, snack foods, and cereals. Avoid all processed foods. Meats and other proteins Fatty cuts of meat. Poultry with skin. Breaded or fried meats. Processed meat. Avoid saturated fats. Dairy Full-fat yogurt, cheese, or milk. Beverages Sweetened drinks, such as soda or iced tea. The items listed above may not be a complete list of foods and beverages you should avoid. Contact a dietitian for more information. Questions to ask a health care provider  Do I need to meet with a diabetes educator?  Do I need to meet with a dietitian?  What number can I call if I have questions?  When are the best times to check my blood glucose? Where to find more information:  American Diabetes Association: diabetes.org  Academy of Nutrition and Dietetics: www.eatright.[employer] of Diabetes and Digestive and Kidney Diseases: CarFlippers.tn  Association of Diabetes Care and Education Specialists: www.diabeteseducator.org Summary  It is important to have healthy  eating habits because your blood sugar (glucose) levels are greatly affected by what you eat and drink.  A healthy meal plan will help you control your blood glucose and maintain a healthy lifestyle.  Your health care provider may recommend that you work with a dietitian to make a meal plan that is best for you.  Keep in mind that carbohydrates (carbs) and alcohol have immediate effects on your blood glucose levels. It is important to count carbs and to use alcohol carefully. This information is not intended to replace advice given to you by your health care provider. Make sure you discuss any questions you have with your health care provider. Document Revised: 10/24/2019 Document Reviewed: 10/24/2019 Elsevier Patient Education  2021 Elsevier Inc.      Edwina Barth, MD [facility]

## 2021-02-03 NOTE — Progress Notes (Signed)
Wt Readings from Last 3 Encounters:  02/03/21 270 lb 6.4 oz (122.7 kg)  04/01/20 276 lb (125.2 kg)  12/07/19 279 lb (126.6 kg)

## 2021-02-04 LAB — COMPREHENSIVE METABOLIC PANEL
ALT: 25 IU/L (ref 0–44)
AST: 19 IU/L (ref 0–40)
Albumin/Globulin Ratio: 1.5 (ref 1.2–2.2)
Albumin: 4.5 g/dL (ref 4.0–5.0)
Alkaline Phosphatase: 80 IU/L (ref 44–121)
BUN/Creatinine Ratio: 12 (ref 9–20)
BUN: 8 mg/dL (ref 6–24)
Bilirubin Total: 0.2 mg/dL (ref 0.0–1.2)
CO2: 22 mmol/L (ref 20–29)
Calcium: 9.1 mg/dL (ref 8.7–10.2)
Chloride: 101 mmol/L (ref 96–106)
Creatinine, Ser: 0.67 mg/dL — ABNORMAL LOW (ref 0.76–1.27)
Globulin, Total: 3 g/dL (ref 1.5–4.5)
Glucose: 169 mg/dL — ABNORMAL HIGH (ref 65–99)
Potassium: 3.5 mmol/L (ref 3.5–5.2)
Sodium: 139 mmol/L (ref 134–144)
Total Protein: 7.5 g/dL (ref 6.0–8.5)
eGFR: 117 mL/min/{1.73_m2} (ref >59)

## 2021-02-07 ENCOUNTER — Other Ambulatory Visit: Payer: Self-pay | Admitting: Emergency Medicine

## 2021-02-07 DIAGNOSIS — E1159 Type 2 diabetes mellitus with other circulatory complications: Secondary | ICD-10-CM

## 2021-02-07 DIAGNOSIS — I152 Hypertension secondary to endocrine disorders: Secondary | ICD-10-CM

## 2021-02-10 ENCOUNTER — Encounter: Payer: Self-pay | Admitting: Emergency Medicine

## 2021-02-11 NOTE — Telephone Encounter (Signed)
No Januvia for the time being.  Stay on glipizide and Metformin.  Thanks.

## 2021-02-11 NOTE — Telephone Encounter (Signed)
What medication

## 2021-04-18 ENCOUNTER — Other Ambulatory Visit: Payer: Self-pay | Admitting: Emergency Medicine

## 2021-04-18 DIAGNOSIS — E1159 Type 2 diabetes mellitus with other circulatory complications: Secondary | ICD-10-CM

## 2021-04-18 DIAGNOSIS — I152 Hypertension secondary to endocrine disorders: Secondary | ICD-10-CM

## 2021-04-25 NOTE — Addendum Note (Signed)
Addended by: Georg Ruddle A on: 04/25/2021 08:40 AM   Modules accepted: Orders

## 2021-05-05 ENCOUNTER — Encounter: Payer: Self-pay | Admitting: Emergency Medicine

## 2021-11-10 ENCOUNTER — Other Ambulatory Visit: Payer: Self-pay | Admitting: Emergency Medicine

## 2021-11-10 DIAGNOSIS — E1159 Type 2 diabetes mellitus with other circulatory complications: Secondary | ICD-10-CM

## 2021-12-07 ENCOUNTER — Other Ambulatory Visit: Payer: Self-pay | Admitting: Emergency Medicine

## 2021-12-07 DIAGNOSIS — E1159 Type 2 diabetes mellitus with other circulatory complications: Secondary | ICD-10-CM

## 2022-02-21 ENCOUNTER — Other Ambulatory Visit: Payer: Self-pay | Admitting: Emergency Medicine

## 2022-02-21 DIAGNOSIS — E1159 Type 2 diabetes mellitus with other circulatory complications: Secondary | ICD-10-CM

## 2022-06-08 ENCOUNTER — Other Ambulatory Visit: Payer: Self-pay | Admitting: Emergency Medicine

## 2022-06-08 DIAGNOSIS — I152 Hypertension secondary to endocrine disorders: Secondary | ICD-10-CM

## 2022-06-17 ENCOUNTER — Encounter: Payer: Self-pay | Admitting: Emergency Medicine

## 2022-06-17 ENCOUNTER — Ambulatory Visit: Payer: Managed Care, Other (non HMO) | Admitting: Emergency Medicine

## 2022-06-17 VITALS — BP 130/80 | HR 83 | Temp 98.1°F | Ht 75.0 in | Wt 256.0 lb

## 2022-06-17 DIAGNOSIS — Z9989 Dependence on other enabling machines and devices: Secondary | ICD-10-CM | POA: Diagnosis not present

## 2022-06-17 DIAGNOSIS — G4733 Obstructive sleep apnea (adult) (pediatric): Secondary | ICD-10-CM | POA: Diagnosis not present

## 2022-06-17 DIAGNOSIS — E1159 Type 2 diabetes mellitus with other circulatory complications: Secondary | ICD-10-CM | POA: Diagnosis not present

## 2022-06-17 DIAGNOSIS — I152 Hypertension secondary to endocrine disorders: Secondary | ICD-10-CM | POA: Diagnosis not present

## 2022-06-17 LAB — POCT GLYCOSYLATED HEMOGLOBIN (HGB A1C): Hemoglobin A1C: 10.3 % — AB (ref 4.0–5.6)

## 2022-06-17 MED ORDER — ROSUVASTATIN CALCIUM 20 MG PO TABS: 20.0000 mg | ORAL_TABLET | Freq: Every day | ORAL | 3 refills | Status: DC

## 2022-06-17 MED ORDER — RYBELSUS 7 MG PO TABS: 7.0000 mg | ORAL_TABLET | Freq: Every day | ORAL | 3 refills | Status: AC

## 2022-06-17 MED ORDER — DAPAGLIFLOZIN PROPANEDIOL 10 MG PO TABS: 10.0000 mg | ORAL_TABLET | Freq: Every day | ORAL | 3 refills | Status: AC

## 2022-06-17 NOTE — Patient Instructions (Signed)

## 2022-06-17 NOTE — Progress Notes (Signed)
Kevin Roy 46 y.o.   Chief Complaint  Patient presents with   office visit    Possibly establishing care    HISTORY OF PRESENT ILLNESS: This is a 46 y.o. male here to reestablish care with me. Last office visit March 2022. Has history of diabetes on glipizide and metformin once a day History of hypertension on amlodipine History of dyslipidemia on rosuvastatin but are with now.  Needs refill. Stop smoking. Eating better and losing weight.  Staying physically active.  HPI   Prior to Admission medications   Medication Sig Start Date End Date Taking? Authorizing Provider  amLODipine (NORVASC) 10 MG tablet TAKE 1 TABLET(10 MG) BY MOUTH DAILY 11/10/21  Yes Leeana Creer, Eilleen KempfMiguel Jose, MD  aspirin (SB LOW DOSE ASA EC) 81 MG EC tablet Take 1 tablet (81 mg total) by mouth daily. Swallow whole. 02/16/18  Yes Ofilia Neaslark, Michael L, PA-C  glipiZIDE (GLUCOTROL) 5 MG tablet TAKE 1 TABLET(5 MG) BY MOUTH TWICE DAILY BEFORE A MEAL 02/21/22  Yes Emrah Ariola, Eilleen KempfMiguel Jose, MD  metFORMIN (GLUCOPHAGE) 500 MG tablet TAKE 2 TABLETS(1000 MG) BY MOUTH TWICE DAILY WITH A MEAL 02/21/22  Yes Mikenna Bunkley, Eilleen KempfMiguel Jose, MD  Multiple Vitamin (MULTI-VITAMIN PO) Take by mouth daily.   Yes [provider]  rosuvastatin (CRESTOR) 20 MG tablet TAKE 1 TABLET(20 MG) BY MOUTH DAILY 06/08/22  Yes Alyssia Heese, Eilleen KempfMiguel Jose, MD  sildenafil (VIAGRA) 100 MG tablet Take 0.5-1 tablets (50-100 mg total) by mouth daily as needed for erectile dysfunction. 01/08/20  Yes Mehreen Azizi, Eilleen KempfMiguel Jose, MD  amoxicillin (AMOXIL) 875 MG tablet Take 875 mg by mouth 2 (two) times daily. 06/08/22   [provider]  ibuprofen (ADVIL) 800 MG tablet Take 800 mg by mouth every 6 (six) hours as needed. 06/08/22   [provider]  triazolam (HALCION) 0.125 MG tablet Take by mouth. 06/08/22   [provider]    No Known Allergies  Patient Active Problem List   Diagnosis Date Noted   Hypertension associated with type 2 diabetes mellitus (HCC)  12/14/2018   Type 2 diabetes mellitus without complication, without long-term current use of insulin (HCC) 11/04/2018   Obstructive sleep apnea treated with continuous positive airway pressure (CPAP) 02/16/2018   OSA (obstructive sleep apnea) 09/29/2017    Past Medical History:  Diagnosis Date   Hypertension    OSA on CPAP     History reviewed. No pertinent surgical history.  Social History   Socioeconomic History   Marital status: Single    Spouse name: Not on file   Number of children: Not on file   Years of education: Not on file   Highest education level: Not on file  Occupational History   Not on file  Tobacco Use   Smoking status: Every Day    Packs/day: 0.50    Years: 25.00    Total pack years: 12.50    Types: Cigarettes   Smokeless tobacco: Never   Tobacco comments:    02/16/18 1/2 PPD  Substance and Sexual Activity   Alcohol use: Yes    Alcohol/week: 1.0 standard drink of alcohol    Types: 1 Cans of beer per week    Comment: OCCASSIONALY   Drug use: No   Sexual activity: Not on file  Other Topics Concern   Not on file  Social History Narrative   Not on file   Social Determinants of Health   Financial Resource Strain: Not on file  Food Insecurity: Not on file  Transportation Needs: Not  on file  Physical Activity: Not on file  Stress: Not on file  Social Connections: Not on file  Intimate Partner Violence: Not on file    Family History  Problem Relation Age of Onset   Hypertension Mother    Hyperlipidemia Mother    Cancer Father    Hyperlipidemia Maternal Grandmother      Review of Systems  Constitutional: Negative.  Negative for chills and fever.  HENT: Negative.  Negative for congestion and sore throat.   Respiratory: Negative.  Negative for cough.   Cardiovascular:  Negative for chest pain and palpitations.  Gastrointestinal:  Negative for abdominal pain, diarrhea, nausea and vomiting.  Genitourinary: Negative.   Skin: Negative.   Negative for rash.  Neurological: Negative.  Negative for dizziness and headaches.  All other systems reviewed and are negative.  Today's Vitals   06/17/22 0826  BP: (!) 140/96  Pulse: 83  Temp: 98.1 F (36.7 C)  TempSrc: Oral  SpO2: 98%  Weight: 256 lb (116.1 kg)  Height: 6\' 3"  (1.905 m)   Body mass index is 32 kg/m. Wt Readings from Last 3 Encounters:  06/17/22 256 lb (116.1 kg)  02/03/21 270 lb 6.4 oz (122.7 kg)  04/01/20 276 lb (125.2 kg)     Physical Exam Vitals reviewed.  Constitutional:      Appearance: Normal appearance.  HENT:     Head: Normocephalic.     Mouth/Throat:     Mouth: Mucous membranes are moist.     Pharynx: Oropharynx is clear.  Eyes:     Extraocular Movements: Extraocular movements intact.     Conjunctiva/sclera: Conjunctivae normal.     Pupils: Pupils are equal, round, and reactive to light.  Cardiovascular:     Rate and Rhythm: Normal rate and regular rhythm.     Pulses: Normal pulses.     Heart sounds: Normal heart sounds.  Pulmonary:     Effort: Pulmonary effort is normal.     Breath sounds: Normal breath sounds.  Musculoskeletal:     Cervical back: No tenderness.  Lymphadenopathy:     Cervical: No cervical adenopathy.  Skin:    General: Skin is warm and dry.  Neurological:     General: No focal deficit present.     Mental Status: He is alert and oriented to person, place, and time.  Psychiatric:        Mood and Affect: Mood normal.        Behavior: Behavior normal.   Results for orders placed or performed in visit on 06/17/22 (from the past 24 hour(s))  POCT glycosylated hemoglobin (Hb A1C)     Status: Abnormal   Collection Time: 06/17/22  9:22 AM  Result Value Ref Range   Hemoglobin A1C 10.3 (A) 4.0 - 5.6 %   HbA1c POC (<> result, manual entry)     HbA1c, POC (prediabetic range)     HbA1c, POC (controlled diabetic range)       ASSESSMENT & PLAN: A total of 46 minutes was spent with the patient and  counseling/coordination of care regarding preparing for this visit, review of most recent office visit notes, review of multiple chronic medical problems and their management, review of all medications and changes made, review of most recent blood work results including today's hemoglobin A1c, cardiovascular risks associated with uncontrolled diabetes, education on nutrition, prognosis, documentation, need for follow-up.  Problem List Items Addressed This Visit       Cardiovascular and Mediastinum   Hypertension associated with  type 2 diabetes mellitus (HCC) - Primary    Well-controlled hypertension with normal blood pressure readings at home. BP Readings from Last 3 Encounters:  06/17/22 130/80  02/03/21 136/84  04/01/20 (!) 150/95  Continue amlodipine 10 mg daily. Uncontrolled diabetes with hemoglobin A1c of 10.3 Lab Results  Component Value Date   HGBA1C 10.3 (A) 06/17/2022  Continue metformin 1000 mg twice a day and glipizide 5 mg in the morning Start Rybelsus 7 mg daily and Farxiga 10 mg daily Depending on patient's insurance's formulary may need to use different choices. Cardiovascular risk associated with uncontrolled diabetes discussed Diet and nutrition discussed Continue rosuvastatin 20 mg daily. Blood work including lipid profile done today. Follow-up in 3 months.        Relevant Medications   rosuvastatin (CRESTOR) 20 MG tablet   Semaglutide (RYBELSUS) 7 MG TABS   dapagliflozin propanediol (FARXIGA) 10 MG TABS tablet   Other Relevant Orders   POCT glycosylated hemoglobin (Hb A1C) (Completed)   CBC with Differential/Platelet   Comprehensive metabolic panel   Lipid panel   Urine Microalbumin w/creat. ratio     Respiratory   Obstructive sleep apnea treated with continuous positive airway pressure (CPAP)    Stable.  Treated with CPAP.      Patient Instructions  Diabetes Mellitus and Nutrition, Adult When you have diabetes, or diabetes mellitus, it is very  important to have healthy eating habits because your blood sugar (glucose) levels are greatly affected by what you eat and drink. Eating healthy foods in the right amounts, at about the same times every day, can help you: Manage your blood glucose. Lower your risk of heart disease. Improve your blood pressure. Reach or maintain a healthy weight. What can affect my meal plan? Every person with diabetes is different, and each person has different needs for a meal plan. Your health care provider may recommend that you work with a dietitian to make a meal plan that is best for you. Your meal plan may vary depending on factors such as: The calories you need. The medicines you take. Your weight. Your blood glucose, blood pressure, and cholesterol levels. Your activity level. Other health conditions you have, such as heart or kidney disease. How do carbohydrates affect me? Carbohydrates, also called carbs, affect your blood glucose level more than any other type of food. Eating carbs raises the amount of glucose in your blood. It is important to know how many carbs you can safely have in each meal. This is different for every person. Your dietitian can help you calculate how many carbs you should have at each meal and for each snack. How does alcohol affect me? Alcohol can cause a decrease in blood glucose (hypoglycemia), especially if you use insulin or take certain diabetes medicines by mouth. Hypoglycemia can be a life-threatening condition. Symptoms of hypoglycemia, such as sleepiness, dizziness, and confusion, are similar to symptoms of having too much alcohol. Do not drink alcohol if: Your health care provider tells you not to drink. You are pregnant, may be pregnant, or are planning to become pregnant. If you drink alcohol: Limit how much you have to: 0-1 drink a day for women. 0-2 drinks a day for men. Know how much alcohol is in your drink. In the [place] one drink equals one 12 oz bottle of  beer (355 mL), one 5 oz glass of wine (148 mL), or one 1 oz glass of hard liquor (44 mL). Keep yourself hydrated with water, diet soda, or unsweetened  iced tea. Keep in mind that regular soda, juice, and other mixers may contain a lot of sugar and must be counted as carbs. What are tips for following this plan?  Reading food labels Start by checking the serving size on the Nutrition Facts label of packaged foods and drinks. The number of calories and the amount of carbs, fats, and other nutrients listed on the label are based on one serving of the item. Many items contain more than one serving per package. Check the total grams (g) of carbs in one serving. Check the number of grams of saturated fats and trans fats in one serving. Choose foods that have a low amount or none of these fats. Check the number of milligrams (mg) of salt (sodium) in one serving. Most people should limit total sodium intake to less than 2,300 mg per day. Always check the nutrition information of foods labeled as "low-fat" or "nonfat." These foods may be higher in added sugar or refined carbs and should be avoided. Talk to your dietitian to identify your daily goals for nutrients listed on the label. Shopping Avoid buying canned, pre-made, or processed foods. These foods tend to be high in fat, sodium, and added sugar. Shop around the outside edge of the grocery store. This is where you will most often find fresh fruits and vegetables, bulk grains, fresh meats, and fresh dairy products. Cooking Use low-heat cooking methods, such as baking, instead of high-heat cooking methods, such as deep frying. Cook using healthy oils, such as olive, canola, or sunflower oil. Avoid cooking with butter, cream, or high-fat meats. Meal planning Eat meals and snacks regularly, preferably at the same times every day. Avoid going long periods of time without eating. Eat foods that are high in fiber, such as fresh fruits, vegetables, beans,  and whole grains. Eat 4-6 oz (112-168 g) of lean protein each day, such as lean meat, chicken, fish, eggs, or tofu. One ounce (oz) (28 g) of lean protein is equal to: 1 oz (28 g) of meat, chicken, or fish. 1 egg.  cup (62 g) of tofu. Eat some foods each day that contain healthy fats, such as avocado, nuts, seeds, and fish. What foods should I eat? Fruits Berries. Apples. Oranges. Peaches. Apricots. Plums. Grapes. Mangoes. Papayas. Pomegranates. Kiwi. Cherries. Vegetables Leafy greens, including lettuce, spinach, kale, chard, collard greens, mustard greens, and cabbage. Beets. Cauliflower. Broccoli. Carrots. Green beans. Tomatoes. Peppers. Onions. Cucumbers. Brussels sprouts. Grains Whole grains, such as whole-wheat or whole-grain bread, crackers, tortillas, cereal, and pasta. Unsweetened oatmeal. Quinoa. Brown or wild rice. Meats and other proteins Seafood. Poultry without skin. Lean cuts of poultry and beef. Tofu. Nuts. Seeds. Dairy Low-fat or fat-free dairy products such as milk, yogurt, and cheese. The items listed above may not be a complete list of foods and beverages you can eat and drink. Contact a dietitian for more information. What foods should I avoid? Fruits Fruits canned with syrup. Vegetables Canned vegetables. Frozen vegetables with butter or cream sauce. Grains Refined white flour and flour products such as bread, pasta, snack foods, and cereals. Avoid all processed foods. Meats and other proteins Fatty cuts of meat. Poultry with skin. Breaded or fried meats. Processed meat. Avoid saturated fats. Dairy Full-fat yogurt, cheese, or milk. Beverages Sweetened drinks, such as soda or iced tea. The items listed above may not be a complete list of foods and beverages you should avoid. Contact a dietitian for more information. Questions to ask a health care provider Do  I need to meet with a certified diabetes care and education specialist? Do I need to meet with a  dietitian? What number can I call if I have questions? When are the best times to check my blood glucose? Where to find more information: American Diabetes Association: diabetes.org Academy of Nutrition and Dietetics: eatright.[employer] of Diabetes and Digestive and Kidney Diseases: StageSync.si Association of Diabetes Care & Education Specialists: diabeteseducator.org Summary It is important to have healthy eating habits because your blood sugar (glucose) levels are greatly affected by what you eat and drink. It is important to use alcohol carefully. A healthy meal plan will help you manage your blood glucose and lower your risk of heart disease. Your health care provider may recommend that you work with a dietitian to make a meal plan that is best for you. This information is not intended to replace advice given to you by your health care provider. Make sure you discuss any questions you have with your health care provider. Document Revised: 06/19/2020 Document Reviewed: 06/19/2020 Elsevier Patient Education  2023 Elsevier Inc.    Edwina Barth, MD [facility]

## 2022-06-17 NOTE — Assessment & Plan Note (Addendum)
Well-controlled hypertension with normal blood pressure readings at home. BP Readings from Last 3 Encounters:  06/17/22 130/80  02/03/21 136/84  04/01/20 (!) 150/95  Continue amlodipine 10 mg daily. Uncontrolled diabetes with hemoglobin A1c of 10.3 Lab Results  Component Value Date   HGBA1C 10.3 (A) 06/17/2022  Continue metformin 1000 mg twice a day and glipizide 5 mg in the morning Start Rybelsus 7 mg daily and Farxiga 10 mg daily Depending on patient's insurance's formulary may need to use different choices. Cardiovascular risk associated with uncontrolled diabetes discussed Diet and nutrition discussed Continue rosuvastatin 20 mg daily. Blood work including lipid profile done today. Follow-up in 3 months.

## 2022-06-17 NOTE — Assessment & Plan Note (Signed)
Stable.  Treated with CPAP.

## 2022-10-29 ENCOUNTER — Other Ambulatory Visit: Payer: Self-pay | Admitting: Emergency Medicine

## 2022-10-29 DIAGNOSIS — I152 Hypertension secondary to endocrine disorders: Secondary | ICD-10-CM

## 2023-04-05 ENCOUNTER — Other Ambulatory Visit: Payer: Self-pay | Admitting: Emergency Medicine

## 2023-04-05 DIAGNOSIS — I152 Hypertension secondary to endocrine disorders: Secondary | ICD-10-CM

## 2023-04-15 ENCOUNTER — Other Ambulatory Visit: Payer: Self-pay | Admitting: *Deleted

## 2023-04-15 DIAGNOSIS — E1159 Type 2 diabetes mellitus with other circulatory complications: Secondary | ICD-10-CM

## 2023-04-15 MED ORDER — METFORMIN HCL 500 MG PO TABS: ORAL_TABLET | ORAL | 0 refills | Status: DC

## 2023-04-20 ENCOUNTER — Encounter: Payer: Self-pay | Admitting: Emergency Medicine

## 2023-04-20 ENCOUNTER — Ambulatory Visit: Payer: BC Managed Care – PPO | Admitting: Emergency Medicine

## 2023-04-20 VITALS — BP 130/80 | HR 82 | Temp 98.4°F | Ht 75.0 in | Wt 251.2 lb

## 2023-04-20 DIAGNOSIS — E1159 Type 2 diabetes mellitus with other circulatory complications: Secondary | ICD-10-CM | POA: Diagnosis not present

## 2023-04-20 DIAGNOSIS — I152 Hypertension secondary to endocrine disorders: Secondary | ICD-10-CM

## 2023-04-20 DIAGNOSIS — Z7984 Long term (current) use of oral hypoglycemic drugs: Secondary | ICD-10-CM | POA: Diagnosis not present

## 2023-04-20 DIAGNOSIS — G4733 Obstructive sleep apnea (adult) (pediatric): Secondary | ICD-10-CM

## 2023-04-20 LAB — POCT GLYCOSYLATED HEMOGLOBIN (HGB A1C): Hemoglobin A1C: 8.7 % — AB (ref 4.0–5.6)

## 2023-04-20 NOTE — Patient Instructions (Signed)

## 2023-04-20 NOTE — Assessment & Plan Note (Addendum)
Elevated blood pressure readings in the office but normal readings at home. Continue amlodipine 10 mg daily Hemoglobin A1c better than before but still not at goal at 8.7. Eating better and losing weight. Benefits of exercise discussed Diet and nutrition discussed Marcelline Deist and Jardiance too expensive and not covered by insurance Continue glipizide 5 mg twice a day and metformin 500 mg twice a day.  Continue rosuvastatin 20 mg daily. We will follow-up in 3 months.

## 2023-04-20 NOTE — Assessment & Plan Note (Signed)
Stable.  On CPAP treatment.  No concerns. 

## 2023-04-20 NOTE — Progress Notes (Signed)
Kevin Roy 47 y.o.   Chief Complaint  Patient presents with   Medical Management of Chronic Issues    F/u appt DM, patient is having joint pain , mostly in his elbows, wrist and knees     HISTORY OF PRESENT ILLNESS: This is a 47 y.o. male here for follow-up of chronic medical problems including hypertension and diabetes Has occasional joint pains. No other complaints or medical concerns today. BP Readings from Last 3 Encounters:  04/20/23 (!) 138/98  06/17/22 130/80  02/03/21 136/84   Lab Results  Component Value Date   HGBA1C 10.3 (A) 06/17/2022   Wt Readings from Last 3 Encounters:  04/20/23 251 lb 4 oz (114 kg)  06/17/22 256 lb (116.1 kg)  02/03/21 270 lb 6.4 oz (122.7 kg)     HPI   Prior to Admission medications   Medication Sig Start Date End Date Taking? Authorizing Provider  amLODipine (NORVASC) 10 MG tablet TAKE 1 TABLET(10 MG) BY MOUTH DAILY 10/30/22  Yes Kewana Sanon, [place], MD  glipiZIDE (GLUCOTROL) 5 MG tablet TAKE 1 TABLET(5 MG) BY MOUTH TWICE DAILY BEFORE A MEAL 04/05/23  Yes Ammara Raj, Eilleen Kempf, MD  metFORMIN (GLUCOPHAGE) 500 MG tablet TAKE 2 TABLETS(1000 MG) BY MOUTH TWICE DAILY WITH A MEAL 04/15/23  Yes Aerabella Galasso, Eilleen Kempf, MD  Multiple Vitamin (MULTI-VITAMIN PO) Take by mouth daily.   Yes [provider]  rosuvastatin (CRESTOR) 20 MG tablet Take 1 tablet (20 mg total) by mouth daily. 06/17/22 06/12/23 Yes Rheanna Sergent, Eilleen Kempf, MD  sildenafil (VIAGRA) 100 MG tablet Take 0.5-1 tablets (50-100 mg total) by mouth daily as needed for erectile dysfunction. 01/08/20  Yes Yuktha Kerchner, Eilleen Kempf, MD  aspirin (SB LOW DOSE ASA EC) 81 MG EC tablet Take 1 tablet (81 mg total) by mouth daily. Swallow whole. Patient not taking: Reported on 04/20/2023 02/16/18   Ofilia Neas, PA-C  dapagliflozin propanediol (FARXIGA) 10 MG TABS tablet Take 1 tablet (10 mg total) by mouth daily before breakfast. Patient not taking: Reported on 04/20/2023 06/17/22 06/12/23   Georgina Quint, MD    No Known Allergies  Patient Active Problem List   Diagnosis Date Noted   Hypertension associated with type 2 diabetes mellitus (HCC) 12/14/2018   Type 2 diabetes mellitus without complication, without long-term current use of insulin (HCC) 11/04/2018   Obstructive sleep apnea treated with continuous positive airway pressure (CPAP) 02/16/2018   OSA (obstructive sleep apnea) 09/29/2017    Past Medical History:  Diagnosis Date   Hypertension    OSA on CPAP     No past surgical history on file.  Social History   Socioeconomic History   Marital status: Single    Spouse name: Not on file   Number of children: Not on file   Years of education: Not on file   Highest education level: Not on file  Occupational History   Not on file  Tobacco Use   Smoking status: Every Day    Packs/day: 0.50    Years: 25.00    Additional pack years: 0.00    Total pack years: 12.50    Types: Cigarettes   Smokeless tobacco: Never   Tobacco comments:    02/16/18 1/2 PPD    Quit smoking 2 weeks ago  Substance and Sexual Activity   Alcohol use: Yes    Alcohol/week: 1.0 standard drink of alcohol    Types: 1 Cans of beer per week    Comment: OCCASSIONALY   Drug use: No  Sexual activity: Not on file  Other Topics Concern   Not on file  Social History Narrative   Not on file   Social Determinants of Health   Financial Resource Strain: Not on file  Food Insecurity: Not on file  Transportation Needs: Not on file  Physical Activity: Not on file  Stress: Not on file  Social Connections: Not on file  Intimate Partner Violence: Not on file    Family History  Problem Relation Age of Onset   Hypertension Mother    Hyperlipidemia Mother    Cancer Father    Hyperlipidemia Maternal Grandmother      Review of Systems  Constitutional: Negative.  Negative for chills and fever.  HENT: Negative.  Negative for congestion and sore throat.   Respiratory: Negative.   Negative for cough and shortness of breath.   Cardiovascular: Negative.  Negative for chest pain and palpitations.  Gastrointestinal:  Negative for abdominal pain, nausea and vomiting.  Musculoskeletal:  Positive for joint pain.  Skin: Negative.  Negative for rash.  Neurological: Negative.  Negative for dizziness and headaches.  All other systems reviewed and are negative.   Vitals:   04/20/23 0941  BP: (!) 138/98  Pulse: 82  Temp: 98.4 F (36.9 C)  SpO2: 97%    Physical Exam Vitals reviewed.  Constitutional:      Appearance: Normal appearance.  HENT:     Head: Normocephalic.  Eyes:     Extraocular Movements: Extraocular movements intact.     Pupils: Pupils are equal, round, and reactive to light.  Cardiovascular:     Rate and Rhythm: Normal rate and regular rhythm.     Pulses: Normal pulses.     Heart sounds: Normal heart sounds.  Pulmonary:     Effort: Pulmonary effort is normal.     Breath sounds: Normal breath sounds.  Musculoskeletal:     Cervical back: No tenderness.  Lymphadenopathy:     Cervical: No cervical adenopathy.  Skin:    General: Skin is warm and dry.  Neurological:     Mental Status: He is alert and oriented to person, place, and time.  Psychiatric:        Mood and Affect: Mood normal.        Behavior: Behavior normal.    Results for orders placed or performed in visit on 04/20/23 (from the past 24 hour(s))  POCT glycosylated hemoglobin (Hb A1C)     Status: Abnormal   Collection Time: 04/20/23 10:36 AM  Result Value Ref Range   Hemoglobin A1C 8.7 (A) 4.0 - 5.6 %   HbA1c POC (<> result, manual entry)     HbA1c, POC (prediabetic range)     HbA1c, POC (controlled diabetic range)       ASSESSMENT & PLAN: A total of 42 minutes was spent with the patient and counseling/coordination of care regarding preparing for this visit, review of most recent office visit notes, review of multiple chronic medical conditions under management, cardiovascular  risks associated with hypertension and diabetes, education on nutrition, review of all medications, review of most recent blood work results including interpretation of today's hemoglobin A1c, prognosis, documentation, and need for follow-up.  Problem List Items Addressed This Visit       Cardiovascular and Mediastinum   Hypertension associated with type 2 diabetes mellitus (HCC) - Primary    Elevated blood pressure readings in the office but normal readings at home. Continue amlodipine 10 mg daily Hemoglobin A1c better than before but still  not at goal at 8.7. Eating better and losing weight. Benefits of exercise discussed Diet and nutrition discussed Marcelline Deist and Jardiance too expensive and not covered by insurance Continue glipizide 5 mg twice a day and metformin 500 mg twice a day.  Continue rosuvastatin 20 mg daily. We will follow-up in 3 months.      Relevant Orders   POCT glycosylated hemoglobin (Hb A1C) (Completed)   CBC with Differential/Platelet   Comprehensive metabolic panel   Lipid panel   Urine Microalbumin w/creat. ratio     Respiratory   Obstructive sleep apnea treated with continuous positive airway pressure (CPAP)    Stable.  On CPAP treatment.  No concerns.      Patient Instructions  Diabetes Mellitus and Nutrition, Adult When you have diabetes, or diabetes mellitus, it is very important to have healthy eating habits because your blood sugar (glucose) levels are greatly affected by what you eat and drink. Eating healthy foods in the right amounts, at about the same times every day, can help you: Manage your blood glucose. Lower your risk of heart disease. Improve your blood pressure. Reach or maintain a healthy weight. What can affect my meal plan? Every person with diabetes is different, and each person has different needs for a meal plan. Your health care provider may recommend that you work with a dietitian to make a meal plan that is best for you. Your  meal plan may vary depending on factors such as: The calories you need. The medicines you take. Your weight. Your blood glucose, blood pressure, and cholesterol levels. Your activity level. Other health conditions you have, such as heart or kidney disease. How do carbohydrates affect me? Carbohydrates, also called carbs, affect your blood glucose level more than any other type of food. Eating carbs raises the amount of glucose in your blood. It is important to know how many carbs you can safely have in each meal. This is different for every person. Your dietitian can help you calculate how many carbs you should have at each meal and for each snack. How does alcohol affect me? Alcohol can cause a decrease in blood glucose (hypoglycemia), especially if you use insulin or take certain diabetes medicines by mouth. Hypoglycemia can be a life-threatening condition. Symptoms of hypoglycemia, such as sleepiness, dizziness, and confusion, are similar to symptoms of having too much alcohol. Do not drink alcohol if: Your health care provider tells you not to drink. You are pregnant, may be pregnant, or are planning to become pregnant. If you drink alcohol: Limit how much you have to: 0-1 drink a day for women. 0-2 drinks a day for men. Know how much alcohol is in your drink. In the [place] one drink equals one 12 oz bottle of beer (355 mL), one 5 oz glass of wine (148 mL), or one 1 oz glass of hard liquor (44 mL). Keep yourself hydrated with water, diet soda, or unsweetened iced tea. Keep in mind that regular soda, juice, and other mixers may contain a lot of sugar and must be counted as carbs. What are tips for following this plan?  Reading food labels Start by checking the serving size on the Nutrition Facts label of packaged foods and drinks. The number of calories and the amount of carbs, fats, and other nutrients listed on the label are based on one serving of the item. Many items contain more than  one serving per package. Check the total grams (g) of carbs in one serving. Check  the number of grams of saturated fats and trans fats in one serving. Choose foods that have a low amount or none of these fats. Check the number of milligrams (mg) of salt (sodium) in one serving. Most people should limit total sodium intake to less than 2,300 mg per day. Always check the nutrition information of foods labeled as "low-fat" or "nonfat." These foods may be higher in added sugar or refined carbs and should be avoided. Talk to your dietitian to identify your daily goals for nutrients listed on the label. Shopping Avoid buying canned, pre-made, or processed foods. These foods tend to be high in fat, sodium, and added sugar. Shop around the outside edge of the grocery store. This is where you will most often find fresh fruits and vegetables, bulk grains, fresh meats, and fresh dairy products. Cooking Use low-heat cooking methods, such as baking, instead of high-heat cooking methods, such as deep frying. Cook using healthy oils, such as olive, canola, or sunflower oil. Avoid cooking with butter, cream, or high-fat meats. Meal planning Eat meals and snacks regularly, preferably at the same times every day. Avoid going long periods of time without eating. Eat foods that are high in fiber, such as fresh fruits, vegetables, beans, and whole grains. Eat 4-6 oz (112-168 g) of lean protein each day, such as lean meat, chicken, fish, eggs, or tofu. One ounce (oz) (28 g) of lean protein is equal to: 1 oz (28 g) of meat, chicken, or fish. 1 egg.  cup (62 g) of tofu. Eat some foods each day that contain healthy fats, such as avocado, nuts, seeds, and fish. What foods should I eat? Fruits Berries. Apples. Oranges. Peaches. Apricots. Plums. Grapes. Mangoes. Papayas. Pomegranates. Kiwi. Cherries. Vegetables Leafy greens, including lettuce, spinach, kale, chard, collard greens, mustard greens, and cabbage. Beets.  Cauliflower. Broccoli. Carrots. Green beans. Tomatoes. Peppers. Onions. Cucumbers. Brussels sprouts. Grains Whole grains, such as whole-wheat or whole-grain bread, crackers, tortillas, cereal, and pasta. Unsweetened oatmeal. Quinoa. Brown or wild rice. Meats and other proteins Seafood. Poultry without skin. Lean cuts of poultry and beef. Tofu. Nuts. Seeds. Dairy Low-fat or fat-free dairy products such as milk, yogurt, and cheese. The items listed above may not be a complete list of foods and beverages you can eat and drink. Contact a dietitian for more information. What foods should I avoid? Fruits Fruits canned with syrup. Vegetables Canned vegetables. Frozen vegetables with butter or cream sauce. Grains Refined white flour and flour products such as bread, pasta, snack foods, and cereals. Avoid all processed foods. Meats and other proteins Fatty cuts of meat. Poultry with skin. Breaded or fried meats. Processed meat. Avoid saturated fats. Dairy Full-fat yogurt, cheese, or milk. Beverages Sweetened drinks, such as soda or iced tea. The items listed above may not be a complete list of foods and beverages you should avoid. Contact a dietitian for more information. Questions to ask a health care provider Do I need to meet with a certified diabetes care and education specialist? Do I need to meet with a dietitian? What number can I call if I have questions? When are the best times to check my blood glucose? Where to find more information: American Diabetes Association: diabetes.org Academy of Nutrition and Dietetics: eatright.[employer] of Diabetes and Digestive and Kidney Diseases: [employer]Sync[employer]si Association of Diabetes Care & Education Specialists: diabeteseducator.org Summary It is important to have healthy eating habits because your blood sugar (glucose) levels are greatly affected by what you eat and drink.  It is important to use alcohol carefully. A healthy meal plan  will help you manage your blood glucose and lower your risk of heart disease. Your health care provider may recommend that you work with a dietitian to make a meal plan that is best for you. This information is not intended to replace advice given to you by your health care provider. Make sure you discuss any questions you have with your health care provider. Document Revised: 06/19/2020 Document Reviewed: 06/19/2020 Elsevier Patient Education  2023 Elsevier Inc.     Edwina Barth, MD [facility]

## 2023-04-23 LAB — COMPREHENSIVE METABOLIC PANEL
ALT: 24 U/L (ref 0–53)
AST: 21 U/L (ref 0–37)
Albumin: 4.2 g/dL (ref 3.5–5.2)
Alkaline Phosphatase: 71 U/L (ref 39–117)
BUN: 11 mg/dL (ref 6–23)
CO2: 26 mEq/L (ref 19–32)
Calcium: 9.3 mg/dL (ref 8.4–10.5)
Chloride: 101 mEq/L (ref 96–112)
Creatinine, Ser: 0.7 mg/dL (ref 0.40–1.50)
GFR: 109.79 mL/min (ref >60.00)
Glucose, Bld: 222 mg/dL — ABNORMAL HIGH (ref 70–99)
Potassium: 3.4 mEq/L — ABNORMAL LOW (ref 3.5–5.1)
Sodium: 138 mEq/L (ref 135–145)
Total Bilirubin: 0.4 mg/dL (ref 0.2–1.2)
Total Protein: 7.3 g/dL (ref 6.0–8.3)

## 2023-04-23 LAB — CBC WITH DIFFERENTIAL/PLATELET
Basophils Absolute: 0 10*3/uL (ref 0.0–0.1)
Basophils Relative: 0.4 % (ref 0.0–3.0)
Eosinophils Absolute: 0.2 10*3/uL (ref 0.0–0.7)
Eosinophils Relative: 2.1 % (ref 0.0–5.0)
HCT: 42.4 % (ref 39.0–52.0)
Hemoglobin: 14 g/dL (ref 13.0–17.0)
Lymphocytes Relative: 28.7 % (ref 12.0–46.0)
Lymphs Abs: 3.3 10*3/uL (ref 0.7–4.0)
MCHC: 33.1 g/dL (ref 30.0–36.0)
MCV: 86.5 fl (ref 78.0–100.0)
Monocytes Absolute: 0.5 10*3/uL (ref 0.1–1.0)
Monocytes Relative: 4.3 % (ref 3.0–12.0)
Neutro Abs: 7.4 10*3/uL (ref 1.4–7.7)
Neutrophils Relative %: 64.5 % (ref 43.0–77.0)
Platelets: 259 10*3/uL (ref 150.0–400.0)
RBC: 4.9 Mil/uL (ref 4.22–5.81)
RDW: 13.6 % (ref 11.5–15.5)
WBC: 11.5 10*3/uL — ABNORMAL HIGH (ref 4.0–10.5)

## 2023-04-23 LAB — LIPID PANEL
Cholesterol: 115 mg/dL (ref 0–200)
HDL: 40.4 mg/dL (ref >39.00)
LDL Cholesterol: 50 mg/dL (ref 0–99)
NonHDL: 74.54
Total CHOL/HDL Ratio: 3
Triglycerides: 121 mg/dL (ref 0.0–149.0)
VLDL: 24.2 mg/dL (ref 0.0–40.0)

## 2023-04-23 LAB — MICROALBUMIN / CREATININE URINE RATIO
Creatinine,U: 144 mg/dL
Microalb Creat Ratio: 4 mg/g (ref 0.0–30.0)
Microalb, Ur: 5.7 mg/dL — ABNORMAL HIGH (ref 0.0–1.9)

## 2023-09-10 ENCOUNTER — Encounter: Payer: Self-pay | Admitting: Emergency Medicine

## 2023-10-31 ENCOUNTER — Other Ambulatory Visit: Payer: Self-pay | Admitting: Emergency Medicine

## 2023-10-31 DIAGNOSIS — E1159 Type 2 diabetes mellitus with other circulatory complications: Secondary | ICD-10-CM

## 2023-12-13 ENCOUNTER — Other Ambulatory Visit: Payer: Self-pay | Admitting: Emergency Medicine

## 2023-12-13 DIAGNOSIS — I152 Hypertension secondary to endocrine disorders: Secondary | ICD-10-CM

## 2024-01-17 ENCOUNTER — Ambulatory Visit: Payer: BC Managed Care – PPO | Admitting: Emergency Medicine

## 2024-01-18 ENCOUNTER — Ambulatory Visit: Payer: BC Managed Care – PPO | Admitting: Emergency Medicine

## 2024-01-18 ENCOUNTER — Encounter: Payer: Self-pay | Admitting: Emergency Medicine

## 2024-01-18 ENCOUNTER — Other Ambulatory Visit: Payer: Self-pay | Admitting: Emergency Medicine

## 2024-01-18 VITALS — BP 152/98 | HR 85 | Temp 98.4°F | Ht 75.0 in | Wt 259.0 lb

## 2024-01-18 DIAGNOSIS — Z7984 Long term (current) use of oral hypoglycemic drugs: Secondary | ICD-10-CM

## 2024-01-18 DIAGNOSIS — F17200 Nicotine dependence, unspecified, uncomplicated: Secondary | ICD-10-CM | POA: Diagnosis not present

## 2024-01-18 DIAGNOSIS — E1159 Type 2 diabetes mellitus with other circulatory complications: Secondary | ICD-10-CM

## 2024-01-18 DIAGNOSIS — F4323 Adjustment disorder with mixed anxiety and depressed mood: Secondary | ICD-10-CM | POA: Diagnosis not present

## 2024-01-18 DIAGNOSIS — I152 Hypertension secondary to endocrine disorders: Secondary | ICD-10-CM | POA: Diagnosis not present

## 2024-01-18 LAB — LIPID PANEL
Cholesterol: 186 mg/dL (ref 0–200)
HDL: 41.9 mg/dL (ref >39.00)
LDL Cholesterol: 92 mg/dL (ref 0–99)
NonHDL: 144.16
Total CHOL/HDL Ratio: 4
Triglycerides: 263 mg/dL — ABNORMAL HIGH (ref 0.0–149.0)
VLDL: 52.6 mg/dL — ABNORMAL HIGH (ref 0.0–40.0)

## 2024-01-18 LAB — CBC WITH DIFFERENTIAL/PLATELET
Basophils Absolute: 0.1 10*3/uL (ref 0.0–0.1)
Basophils Relative: 0.8 % (ref 0.0–3.0)
Eosinophils Absolute: 0.2 10*3/uL (ref 0.0–0.7)
Eosinophils Relative: 1.5 % (ref 0.0–5.0)
HCT: 43.8 % (ref 39.0–52.0)
Hemoglobin: 14.8 g/dL (ref 13.0–17.0)
Lymphocytes Relative: 36.7 % (ref 12.0–46.0)
Lymphs Abs: 3.7 10*3/uL (ref 0.7–4.0)
MCHC: 33.7 g/dL (ref 30.0–36.0)
MCV: 87 fL (ref 78.0–100.0)
Monocytes Absolute: 0.5 10*3/uL (ref 0.1–1.0)
Monocytes Relative: 5 % (ref 3.0–12.0)
Neutro Abs: 5.7 10*3/uL (ref 1.4–7.7)
Neutrophils Relative %: 56 % (ref 43.0–77.0)
Platelets: 315 10*3/uL (ref 150.0–400.0)
RBC: 5.04 Mil/uL (ref 4.22–5.81)
RDW: 13.8 % (ref 11.5–15.5)
WBC: 10.2 10*3/uL (ref 4.0–10.5)

## 2024-01-18 LAB — COMPREHENSIVE METABOLIC PANEL
ALT: 22 U/L (ref 0–53)
AST: 18 U/L (ref 0–37)
Albumin: 4.3 g/dL (ref 3.5–5.2)
Alkaline Phosphatase: 72 U/L (ref 39–117)
BUN: 7 mg/dL (ref 6–23)
CO2: 29 meq/L (ref 19–32)
Calcium: 8.8 mg/dL (ref 8.4–10.5)
Chloride: 100 meq/L (ref 96–112)
Creatinine, Ser: 0.61 mg/dL (ref 0.40–1.50)
GFR: 113.86 mL/min (ref >60.00)
Glucose, Bld: 219 mg/dL — ABNORMAL HIGH (ref 70–99)
Potassium: 3.3 meq/L — ABNORMAL LOW (ref 3.5–5.1)
Sodium: 138 meq/L (ref 135–145)
Total Bilirubin: 0.4 mg/dL (ref 0.2–1.2)
Total Protein: 7.7 g/dL (ref 6.0–8.3)

## 2024-01-18 LAB — POCT GLYCOSYLATED HEMOGLOBIN (HGB A1C): Hemoglobin A1C: 10.3 % — AB (ref 4.0–5.6)

## 2024-01-18 LAB — MICROALBUMIN / CREATININE URINE RATIO
Creatinine,U: 88.8 mg/dL
Microalb Creat Ratio: 68.4 mg/g — ABNORMAL HIGH (ref 0.0–30.0)
Microalb, Ur: 6.1 mg/dL — ABNORMAL HIGH (ref 0.0–1.9)

## 2024-01-18 MED ORDER — VALSARTAN-HYDROCHLOROTHIAZIDE 80-12.5 MG PO TABS: 1.0000 | ORAL_TABLET | Freq: Every day | ORAL | 3 refills | Status: AC

## 2024-01-18 MED ORDER — TIRZEPATIDE 5 MG/0.5ML ~~LOC~~ SOAJ: 5.0000 mg | SUBCUTANEOUS | 3 refills | Status: DC

## 2024-01-18 MED ORDER — EMPAGLIFLOZIN 10 MG PO TABS: 10.0000 mg | ORAL_TABLET | Freq: Every day | ORAL | 3 refills | Status: AC

## 2024-01-18 NOTE — Patient Instructions (Signed)

## 2024-01-18 NOTE — Assessment & Plan Note (Signed)
 Active and affecting quality of life.  Known triggers. Recommend psychiatric evaluation.  Referral placed today.

## 2024-01-18 NOTE — Progress Notes (Signed)
 Kevin Roy 48 y.o.   Chief Complaint  Patient presents with   Annual Exam    Patient states he want to talk about anxiety, he a lot go on 2024 and missed his follow up appt.     HISTORY OF PRESENT ILLNESS: This is a 48 y.o. male here for follow-up of diabetes and hypertension Last office visit May 2024 Compliant with medications.  Taking amlodipine for hypertension Taking metformin and glipizide for diabetes Complaining about recurrent anxiety.  Known triggers. Lab Results  Component Value Date   HGBA1C 8.7 (A) 04/20/2023   BP Readings from Last 3 Encounters:  01/18/24 (!) 152/98  04/20/23 130/80  06/17/22 130/80   Wt Readings from Last 3 Encounters:  01/18/24 259 lb (117.5 kg)  04/20/23 251 lb 4 oz (114 kg)  06/17/22 256 lb (116.1 kg)     HPI   Prior to Admission medications   Medication Sig Start Date End Date Taking? Authorizing Provider  amLODipine (NORVASC) 10 MG tablet TAKE 1 TABLET(10 MG) BY MOUTH DAILY 12/13/23  Yes Diany Formosa, Eilleen Kempf, MD  aspirin (SB LOW DOSE ASA EC) 81 MG EC tablet Take 1 tablet (81 mg total) by mouth daily. Swallow whole. 02/16/18  Yes Ofilia Neas, PA-C  glipiZIDE (GLUCOTROL) 5 MG tablet TAKE 1 TABLET(5 MG) BY MOUTH TWICE DAILY BEFORE A MEAL 04/05/23  Yes Tayshon Winker, Eilleen Kempf, MD  metFORMIN (GLUCOPHAGE) 500 MG tablet TAKE 2 TABLETS(1000 MG) BY MOUTH TWICE DAILY WITH A MEAL 11/01/23  Yes Luay Balding, Eilleen Kempf, MD  Multiple Vitamin (MULTI-VITAMIN PO) Take by mouth daily.   Yes [provider]  sildenafil (VIAGRA) 100 MG tablet Take 0.5-1 tablets (50-100 mg total) by mouth daily as needed for erectile dysfunction. 01/08/20  Yes Jeraldin Fesler, Eilleen Kempf, MD  rosuvastatin (CRESTOR) 20 MG tablet Take 1 tablet (20 mg total) by mouth daily. Patient not taking: Reported on 01/18/2024 06/17/22 06/12/23  Georgina Quint, MD    No Known Allergies  Patient Active Problem List   Diagnosis Date Noted   Hypertension associated with type 2  diabetes mellitus (HCC) 12/14/2018   Type 2 diabetes mellitus without complication, without long-term current use of insulin (HCC) 11/04/2018   Obstructive sleep apnea treated with continuous positive airway pressure (CPAP) 02/16/2018   OSA (obstructive sleep apnea) 09/29/2017    Past Medical History:  Diagnosis Date   Hypertension    OSA on CPAP     No past surgical history on file.  Social History   Socioeconomic History   Marital status: Single    Spouse name: Not on file   Number of children: Not on file   Years of education: Not on file   Highest education level: Not on file  Occupational History   Not on file  Tobacco Use   Smoking status: Every Day    Current packs/day: 0.50    Average packs/day: 0.5 packs/day for 25.0 years (12.5 ttl pk-yrs)    Types: Cigarettes   Smokeless tobacco: Never   Tobacco comments:    02/16/18 1/2 PPD    Quit smoking 2 weeks ago  Substance and Sexual Activity   Alcohol use: Yes    Alcohol/week: 1.0 standard drink of alcohol    Types: 1 Cans of beer per week    Comment: OCCASSIONALY   Drug use: No   Sexual activity: Not on file  Other Topics Concern   Not on file  Social History Narrative   Not on file   Social  Drivers of Corporate investment banker Strain: Not on file  Food Insecurity: Not on file  Transportation Needs: Not on file  Physical Activity: Not on file  Stress: Not on file  Social Connections: Not on file  Intimate Partner Violence: Not on file    Family History  Problem Relation Age of Onset   Hypertension Mother    Hyperlipidemia Mother    Cancer Father    Hyperlipidemia Maternal Grandmother      Review of Systems  Constitutional: Negative.  Negative for chills and fever.  HENT: Negative.  Negative for congestion and sore throat.   Respiratory:  Negative for cough and shortness of breath.   Cardiovascular: Negative.  Negative for chest pain and palpitations.  Gastrointestinal:  Negative for abdominal  pain, diarrhea, nausea and vomiting.  Genitourinary: Negative.  Negative for dysuria and urgency.  Skin: Negative.  Negative for rash.  Neurological: Negative.  Negative for dizziness and headaches.  All other systems reviewed and are negative.   Vitals:   01/18/24 1339  BP: (!) 152/98  Pulse: 85  Temp: 98.4 F (36.9 C)  SpO2: 95%    Physical Exam Vitals reviewed.  Constitutional:      Appearance: Normal appearance.  HENT:     Head: Normocephalic.     Mouth/Throat:     Mouth: Mucous membranes are moist.     Pharynx: Oropharynx is clear.  Eyes:     Extraocular Movements: Extraocular movements intact.     Pupils: Pupils are equal, round, and reactive to light.  Cardiovascular:     Rate and Rhythm: Normal rate and regular rhythm.     Pulses: Normal pulses.     Heart sounds: Normal heart sounds.  Pulmonary:     Effort: Pulmonary effort is normal.     Breath sounds: Normal breath sounds.  Musculoskeletal:     Cervical back: No rigidity or tenderness.  Skin:    General: Skin is warm and dry.     Capillary Refill: Capillary refill takes less than 2 seconds.  Neurological:     General: No focal deficit present.     Mental Status: He is alert and oriented to person, place, and time.  Psychiatric:        Mood and Affect: Mood normal.        Behavior: Behavior normal.    Results for orders placed or performed in visit on 01/18/24 (from the past 24 hours)  POCT HgB A1C     Status: Abnormal   Collection Time: 01/18/24  1:51 PM  Result Value Ref Range   Hemoglobin A1C 10.3 (A) 4.0 - 5.6 %   HbA1c POC (<> result, manual entry)     HbA1c, POC (prediabetic range)     HbA1c, POC (controlled diabetic range)       ASSESSMENT & PLAN: A total of 47 minutes was spent with the patient and counseling/coordination of care regarding preparing for this visit, review of most recent office visit notes, review of multiple chronic medical conditions and their management, cardiovascular  risks associated with uncontrolled diabetes and hypertension, review of all medications and changes made, review of most recent bloodwork results including interpretation of today's hemoglobin A1c, review of health maintenance items, education on nutrition, prognosis, documentation, and need for follow up.   Problem List Items Addressed This Visit       Cardiovascular and Mediastinum   Hypertension associated with type 2 diabetes mellitus (HCC) - Primary   Uncontrolled hypertension Continue  amlodipine 10 mg daily and start valsartan HCT 80-12.5 mg Uncontrolled diabetes with hemoglobin A1c of 10.3 Cardiovascular risks associated with uncontrolled hypertension and uncontrolled diabetes discussed Diet and nutrition discussed Blood work done today Recommend to continue glipizide 5 mg twice a day and metformin 500 mg twice a day Recommend to start weekly Mounjaro or GLP-1 agonist covered by insurance Follow-up in 3 months      Relevant Medications   valsartan-hydrochlorothiazide (DIOVAN-HCT) 80-12.5 MG tablet   tirzepatide (MOUNJARO) 5 MG/0.5ML Pen   Other Relevant Orders   POCT HgB A1C (Completed)   Urine Microalbumin w/creat. ratio   Comprehensive metabolic panel   CBC with Differential/Platelet   Lipid panel     Other   Situational mixed anxiety and depressive disorder   Active and affecting quality of life.  Known triggers. Recommend psychiatric evaluation.  Referral placed today.      Relevant Orders   Ambulatory referral to Psychology   Current smoker   Cardiovascular and cancer risks associated with smoking discussed. Smoking cessation advice given      Patient Instructions  Diabetes Mellitus and Nutrition, Adult When you have diabetes, or diabetes mellitus, it is very important to have healthy eating habits because your blood sugar (glucose) levels are greatly affected by what you eat and drink. Eating healthy foods in the right amounts, at about the same times every  day, can help you: Manage your blood glucose. Lower your risk of heart disease. Improve your blood pressure. Reach or maintain a healthy weight. What can affect my meal plan? Every person with diabetes is different, and each person has different needs for a meal plan. Your health care provider may recommend that you work with a dietitian to make a meal plan that is best for you. Your meal plan may vary depending on factors such as: The calories you need. The medicines you take. Your weight. Your blood glucose, blood pressure, and cholesterol levels. Your activity level. Other health conditions you have, such as heart or kidney disease. How do carbohydrates affect me? Carbohydrates, also called carbs, affect your blood glucose level more than any other type of food. Eating carbs raises the amount of glucose in your blood. It is important to know how many carbs you can safely have in each meal. This is different for every person. Your dietitian can help you calculate how many carbs you should have at each meal and for each snack. How does alcohol affect me? Alcohol can cause a decrease in blood glucose (hypoglycemia), especially if you use insulin or take certain diabetes medicines by mouth. Hypoglycemia can be a life-threatening condition. Symptoms of hypoglycemia, such as sleepiness, dizziness, and confusion, are similar to symptoms of having too much alcohol. Do not drink alcohol if: Your health care provider tells you not to drink. You are pregnant, may be pregnant, or are planning to become pregnant. If you drink alcohol: Limit how much you have to: 0-1 drink a day for women. 0-2 drinks a day for men. Know how much alcohol is in your drink. In the [place] one drink equals one 12 oz bottle of beer (355 mL), one 5 oz glass of wine (148 mL), or one 1 oz glass of hard liquor (44 mL). Keep yourself hydrated with water, diet soda, or unsweetened iced tea. Keep in mind that regular soda, juice,  and other mixers may contain a lot of sugar and must be counted as carbs. What are tips for following this plan?  Reading  food labels Start by checking the serving size on the Nutrition Facts label of packaged foods and drinks. The number of calories and the amount of carbs, fats, and other nutrients listed on the label are based on one serving of the item. Many items contain more than one serving per package. Check the total grams (g) of carbs in one serving. Check the number of grams of saturated fats and trans fats in one serving. Choose foods that have a low amount or none of these fats. Check the number of milligrams (mg) of salt (sodium) in one serving. Most people should limit total sodium intake to less than 2,300 mg per day. Always check the nutrition information of foods labeled as "low-fat" or "nonfat." These foods may be higher in added sugar or refined carbs and should be avoided. Talk to your dietitian to identify your daily goals for nutrients listed on the label. Shopping Avoid buying canned, pre-made, or processed foods. These foods tend to be high in fat, sodium, and added sugar. Shop around the outside edge of the grocery store. This is where you will most often find fresh fruits and vegetables, bulk grains, fresh meats, and fresh dairy products. Cooking Use low-heat cooking methods, such as baking, instead of high-heat cooking methods, such as deep frying. Cook using healthy oils, such as olive, canola, or sunflower oil. Avoid cooking with butter, cream, or high-fat meats. Meal planning Eat meals and snacks regularly, preferably at the same times every day. Avoid going long periods of time without eating. Eat foods that are high in fiber, such as fresh fruits, vegetables, beans, and whole grains. Eat 4-6 oz (112-168 g) of lean protein each day, such as lean meat, chicken, fish, eggs, or tofu. One ounce (oz) (28 g) of lean protein is equal to: 1 oz (28 g) of meat, chicken, or  fish. 1 egg.  cup (62 g) of tofu. Eat some foods each day that contain healthy fats, such as avocado, nuts, seeds, and fish. What foods should I eat? Fruits Berries. Apples. Oranges. Peaches. Apricots. Plums. Grapes. Mangoes. Papayas. Pomegranates. Kiwi. Cherries. Vegetables Leafy greens, including lettuce, spinach, kale, chard, collard greens, mustard greens, and cabbage. Beets. Cauliflower. Broccoli. Carrots. Green beans. Tomatoes. Peppers. Onions. Cucumbers. Brussels sprouts. Grains Whole grains, such as whole-wheat or whole-grain bread, crackers, tortillas, cereal, and pasta. Unsweetened oatmeal. Quinoa. Brown or wild rice. Meats and other proteins Seafood. Poultry without skin. Lean cuts of poultry and beef. Tofu. Nuts. Seeds. Dairy Low-fat or fat-free dairy products such as milk, yogurt, and cheese. The items listed above may not be a complete list of foods and beverages you can eat and drink. Contact a dietitian for more information. What foods should I avoid? Fruits Fruits canned with syrup. Vegetables Canned vegetables. Frozen vegetables with butter or cream sauce. Grains Refined white flour and flour products such as bread, pasta, snack foods, and cereals. Avoid all processed foods. Meats and other proteins Fatty cuts of meat. Poultry with skin. Breaded or fried meats. Processed meat. Avoid saturated fats. Dairy Full-fat yogurt, cheese, or milk. Beverages Sweetened drinks, such as soda or iced tea. The items listed above may not be a complete list of foods and beverages you should avoid. Contact a dietitian for more information. Questions to ask a health care provider Do I need to meet with a certified diabetes care and education specialist? Do I need to meet with a dietitian? What number can I call if I have questions? When are the best  times to check my blood glucose? Where to find more information: American Diabetes Association: diabetes.org Academy of Nutrition and  Dietetics: eatright.[employer] of Diabetes and Digestive and Kidney Diseases: StageSync.si Association of Diabetes Care & Education Specialists: diabeteseducator.org Summary It is important to have healthy eating habits because your blood sugar (glucose) levels are greatly affected by what you eat and drink. It is important to use alcohol carefully. A healthy meal plan will help you manage your blood glucose and lower your risk of heart disease. Your health care provider may recommend that you work with a dietitian to make a meal plan that is best for you. This information is not intended to replace advice given to you by your health care provider. Make sure you discuss any questions you have with your health care provider. Document Revised: 06/18/2020 Document Reviewed: 06/19/2020 Elsevier Patient Education  2024 Elsevier Inc.     Edwina Barth, MD [facility]

## 2024-01-18 NOTE — Assessment & Plan Note (Signed)
 Cardiovascular and cancer risks associated with smoking discussed. Smoking cessation advice given.

## 2024-01-18 NOTE — Assessment & Plan Note (Addendum)
 Uncontrolled hypertension Continue amlodipine 10 mg daily and start valsartan HCT 80-12.5 mg Uncontrolled diabetes with hemoglobin A1c of 10.3 Cardiovascular risks associated with uncontrolled hypertension and uncontrolled diabetes discussed Diet and nutrition discussed Blood work done today Recommend to continue glipizide 5 mg twice a day and metformin 500 mg twice a day Recommend to start weekly Mounjaro or GLP-1 agonist covered by insurance Follow-up in 3 months

## 2024-01-20 ENCOUNTER — Telehealth: Payer: Self-pay

## 2024-01-20 NOTE — Telephone Encounter (Signed)
 Patient was identified as falling into the [place] Measure - Diabetes.   Patient was: Is scheduled with PCP for 3 month f/u in May. Had a recent A1C checked on 01/18/24.

## 2024-02-22 ENCOUNTER — Telehealth: Payer: Self-pay

## 2024-02-22 NOTE — Telephone Encounter (Signed)
 Patient was identified as falling into the [place] Measure - Diabetes.   Patient was: Appointment scheduled for lab or office visit for A1c. Patient is on a 6 month schedule, A1c due in August, patient has a follow up with PCP

## 2024-04-18 ENCOUNTER — Ambulatory Visit: Payer: BC Managed Care – PPO | Admitting: Emergency Medicine

## 2024-04-25 ENCOUNTER — Ambulatory Visit: Admitting: Emergency Medicine

## 2024-04-25 ENCOUNTER — Encounter: Payer: Self-pay | Admitting: Emergency Medicine

## 2024-04-25 VITALS — BP 152/100 | HR 85 | Temp 98.7°F | Ht 75.0 in | Wt 259.0 lb

## 2024-04-25 DIAGNOSIS — Z7985 Long-term (current) use of injectable non-insulin antidiabetic drugs: Secondary | ICD-10-CM

## 2024-04-25 DIAGNOSIS — F17200 Nicotine dependence, unspecified, uncomplicated: Secondary | ICD-10-CM | POA: Diagnosis not present

## 2024-04-25 DIAGNOSIS — Z1211 Encounter for screening for malignant neoplasm of colon: Secondary | ICD-10-CM

## 2024-04-25 DIAGNOSIS — G4733 Obstructive sleep apnea (adult) (pediatric): Secondary | ICD-10-CM | POA: Diagnosis not present

## 2024-04-25 DIAGNOSIS — E1159 Type 2 diabetes mellitus with other circulatory complications: Secondary | ICD-10-CM | POA: Diagnosis not present

## 2024-04-25 DIAGNOSIS — I152 Hypertension secondary to endocrine disorders: Secondary | ICD-10-CM

## 2024-04-25 DIAGNOSIS — Z7984 Long term (current) use of oral hypoglycemic drugs: Secondary | ICD-10-CM

## 2024-04-25 LAB — POCT GLYCOSYLATED HEMOGLOBIN (HGB A1C): Hemoglobin A1C: 6.5 % — AB (ref 4.0–5.6)

## 2024-04-25 MED ORDER — ONDANSETRON 4 MG PO TBDP: 4.0000 mg | ORAL_TABLET | Freq: Three times a day (TID) | ORAL | 0 refills | Status: AC | PRN

## 2024-04-25 MED ORDER — ROSUVASTATIN CALCIUM 20 MG PO TABS: 20.0000 mg | ORAL_TABLET | Freq: Every day | ORAL | 3 refills | Status: AC

## 2024-04-25 NOTE — Patient Instructions (Signed)
 Hypertension, Adult High blood pressure (hypertension) is when the force of blood pumping through the arteries is too strong. The arteries are the blood vessels that carry blood from the heart throughout the body. Hypertension forces the heart to work harder to pump blood and may cause arteries to become narrow or stiff. Untreated or uncontrolled hypertension can lead to a heart attack, heart failure, a stroke, kidney disease, and other problems. A blood pressure reading consists of a higher number over a lower number. Ideally, your blood pressure should be below 120/80. The first ("top") number is called the systolic pressure. It is a measure of the pressure in your arteries as your heart beats. The second ("bottom") number is called the diastolic pressure. It is a measure of the pressure in your arteries as the heart relaxes. What are the causes? The exact cause of this condition is not known. There are some conditions that result in high blood pressure. What increases the risk? Certain factors may make you more likely to develop high blood pressure. Some of these risk factors are under your control, including: Smoking. Not getting enough exercise or physical activity. Being overweight. Having too much fat, sugar, calories, or salt (sodium) in your diet. Drinking too much alcohol. Other risk factors include: Having a personal history of heart disease, diabetes, high cholesterol, or kidney disease. Stress. Having a family history of high blood pressure and high cholesterol. Having obstructive sleep apnea. Age. The risk increases with age. What are the signs or symptoms? High blood pressure may not cause symptoms. Very high blood pressure (hypertensive crisis) may cause: Headache. Fast or irregular heartbeats (palpitations). Shortness of breath. Nosebleed. Nausea and vomiting. Vision changes. Severe chest pain, dizziness, and seizures. How is this diagnosed? This condition is diagnosed by  measuring your blood pressure while you are seated, with your arm resting on a flat surface, your legs uncrossed, and your feet flat on the floor. The cuff of the blood pressure monitor will be placed directly against the skin of your upper arm at the level of your heart. Blood pressure should be measured at least twice using the same arm. Certain conditions can cause a difference in blood pressure between your right and left arms. If you have a high blood pressure reading during one visit or you have normal blood pressure with other risk factors, you may be asked to: Return on a different day to have your blood pressure checked again. Monitor your blood pressure at home for 1 week or longer. If you are diagnosed with hypertension, you may have other blood or imaging tests to help your health care provider understand your overall risk for other conditions. How is this treated? This condition is treated by making healthy lifestyle changes, such as eating healthy foods, exercising more, and reducing your alcohol intake. You may be referred for counseling on a healthy diet and physical activity. Your health care provider may prescribe medicine if lifestyle changes are not enough to get your blood pressure under control and if: Your systolic blood pressure is above 130. Your diastolic blood pressure is above 80. Your personal target blood pressure may vary depending on your medical conditions, your age, and other factors. Follow these instructions at home: Eating and drinking  Eat a diet that is high in fiber and potassium, and low in sodium, added sugar, and fat. An example of this eating plan is called the DASH diet. DASH stands for Dietary Approaches to Stop Hypertension. To eat this way: Eat  plenty of fresh fruits and vegetables. Try to fill one half of your plate at each meal with fruits and vegetables. Eat whole grains, such as whole-wheat pasta, brown rice, or whole-grain bread. Fill about one  fourth of your plate with whole grains. Eat or drink low-fat dairy products, such as skim milk or low-fat yogurt. Avoid fatty cuts of meat, processed or cured meats, and poultry with skin. Fill about one fourth of your plate with lean proteins, such as fish, chicken without skin, beans, eggs, or tofu. Avoid pre-made and processed foods. These tend to be higher in sodium, added sugar, and fat. Reduce your daily sodium intake. Many people with hypertension should eat less than 1,500 mg of sodium a day. Do not drink alcohol if: Your health care provider tells you not to drink. You are pregnant, may be pregnant, or are planning to become pregnant. If you drink alcohol: Limit how much you have to: 0-1 drink a day for women. 0-2 drinks a day for men. Know how much alcohol is in your drink. In the [place] one drink equals one 12 oz bottle of beer (355 mL), one 5 oz glass of wine (148 mL), or one 1 oz glass of hard liquor (44 mL). Lifestyle  Work with your health care provider to maintain a healthy body weight or to lose weight. Ask what an ideal weight is for you. Get at least 30 minutes of exercise that causes your heart to beat faster (aerobic exercise) most days of the week. Activities may include walking, swimming, or biking. Include exercise to strengthen your muscles (resistance exercise), such as Pilates or lifting weights, as part of your weekly exercise routine. Try to do these types of exercises for 30 minutes at least 3 days a week. Do not use any products that contain nicotine or tobacco. These products include cigarettes, chewing tobacco, and vaping devices, such as e-cigarettes. If you need help quitting, ask your health care provider. Monitor your blood pressure at home as told by your health care provider. Keep all follow-up visits. This is important. Medicines Take over-the-counter and prescription medicines only as told by your health care provider. Follow directions carefully. Blood  pressure medicines must be taken as prescribed. Do not skip doses of blood pressure medicine. Doing this puts you at risk for problems and can make the medicine less effective. Ask your health care provider about side effects or reactions to medicines that you should watch for. Contact a health care provider if you: Think you are having a reaction to a medicine you are taking. Have headaches that keep coming back (recurring). Feel dizzy. Have swelling in your ankles. Have trouble with your vision. Get help right away if you: Develop a severe headache or confusion. Have unusual weakness or numbness. Feel faint. Have severe pain in your chest or abdomen. Vomit repeatedly. Have trouble breathing. These symptoms may be an emergency. Get help right away. Call 911. Do not wait to see if the symptoms will go away. Do not drive yourself to the hospital. Summary Hypertension is when the force of blood pumping through your arteries is too strong. If this condition is not controlled, it may put you at risk for serious complications. Your personal target blood pressure may vary depending on your medical conditions, your age, and other factors. For most people, a normal blood pressure is less than 120/80. Hypertension is treated with lifestyle changes, medicines, or a combination of both. Lifestyle changes include losing weight, eating a healthy,  low-sodium diet, exercising more, and limiting alcohol. This information is not intended to replace advice given to you by your health care provider. Make sure you discuss any questions you have with your health care provider. Document Revised: 09/23/2021 Document Reviewed: 09/23/2021 Elsevier Patient Education  2024 [employer].

## 2024-04-25 NOTE — Assessment & Plan Note (Signed)
 Cardiovascular and cancer risks associated with smoking discussed. Smoking cessation advice given.

## 2024-04-25 NOTE — Assessment & Plan Note (Signed)
Stable.  On CPAP treatment.  No concerns. 

## 2024-04-25 NOTE — Progress Notes (Signed)
 Kevin Roy 48 y.o.   Chief Complaint  Patient presents with   Follow-up    3 month f/u for HTN / DM. Patient mentions wanting something for nausea he says this is week 5 on mounjaro and it makes he nauseous     HISTORY OF PRESENT ILLNESS: This is a 48 y.o. male here for 67-month follow-up of hypertension and diabetes Overall doing well but Mounjaro sometimes makes him nauseous  Wt Readings from Last 3 Encounters:  04/25/24 259 lb (117.5 kg)  01/18/24 259 lb (117.5 kg)  04/20/23 251 lb 4 oz (114 kg)     HPI   Prior to Admission medications   Medication Sig Start Date End Date Taking? Authorizing Provider  amLODipine  (NORVASC ) 10 MG tablet TAKE 1 TABLET(10 MG) BY MOUTH DAILY 12/13/23  Yes Kemani Heidel, Isidro Margo, MD  empagliflozin  (JARDIANCE ) 10 MG TABS tablet Take 1 tablet (10 mg total) by mouth daily before breakfast. 01/18/24  Yes Kristalyn Bergstresser, Isidro Margo, MD  glipiZIDE  (GLUCOTROL ) 5 MG tablet TAKE 1 TABLET(5 MG) BY MOUTH TWICE DAILY BEFORE A MEAL 04/05/23  Yes Marria Mathison, Isidro Margo, MD  metFORMIN  (GLUCOPHAGE ) 500 MG tablet TAKE 2 TABLETS(1000 MG) BY MOUTH TWICE DAILY WITH A MEAL 11/01/23  Yes Antavius Sperbeck, Isidro Margo, MD  Multiple Vitamin (MULTI-VITAMIN PO) Take by mouth daily.   Yes [provider]  tirzepatide Florence Hunt) 5 MG/0.5ML Pen Inject 5 mg into the skin once a week. 01/18/24  Yes Joliet Mallozzi, Isidro Margo, MD  valsartan -hydrochlorothiazide  (DIOVAN -HCT) 80-12.5 MG tablet Take 1 tablet by mouth daily. 01/18/24  Yes Berl Bonfanti, Isidro Margo, MD  aspirin  (SB LOW DOSE ASA EC) 81 MG EC tablet Take 1 tablet (81 mg total) by mouth daily. Swallow whole. Patient not taking: Reported on 04/25/2024 02/16/18   Bettie Bruce, PA-C  rosuvastatin  (CRESTOR ) 20 MG tablet Take 1 tablet (20 mg total) by mouth daily. Patient not taking: Reported on 01/18/2024 06/17/22 06/12/23  Olita Takeshita Jose, MD  sildenafil  (VIAGRA ) 100 MG tablet Take 0.5-1 tablets (50-100 mg total) by mouth daily as needed for  erectile dysfunction. Patient not taking: Reported on 04/25/2024 01/08/20   Elvira Hammersmith, MD    No Known Allergies  Patient Active Problem List   Diagnosis Date Noted   Situational mixed anxiety and depressive disorder 01/18/2024   Current smoker 01/18/2024   Hypertension associated with type 2 diabetes mellitus (HCC) 12/14/2018   Type 2 diabetes mellitus without complication, without long-term current use of insulin (HCC) 11/04/2018   Obstructive sleep apnea treated with continuous positive airway pressure (CPAP) 02/16/2018   OSA (obstructive sleep apnea) 09/29/2017    Past Medical History:  Diagnosis Date   Hypertension    OSA on CPAP     No past surgical history on file.  Social History   Socioeconomic History   Marital status: Single    Spouse name: Not on file   Number of children: Not on file   Years of education: Not on file   Highest education level: Some college, no degree  Occupational History   Not on file  Tobacco Use   Smoking status: Every Day    Current packs/day: 0.50    Average packs/day: 0.5 packs/day for 25.0 years (12.5 ttl pk-yrs)    Types: Cigarettes   Smokeless tobacco: Never   Tobacco comments:    02/16/18 1/2 PPD    Quit smoking 2 weeks ago  Substance and Sexual Activity   Alcohol use: Yes    Alcohol/week: 1.0 standard  drink of alcohol    Types: 1 Cans of beer per week    Comment: OCCASSIONALY   Drug use: No   Sexual activity: Not on file  Other Topics Concern   Not on file  Social History Narrative   Not on file   Social Drivers of Health   Financial Resource Strain: Low Risk  (04/24/2024)   Overall Financial Resource Strain (CARDIA)    Difficulty of Paying Living Expenses: Not hard at all  Food Insecurity: Unknown (04/24/2024)   Hunger Vital Sign    Worried About Running Out of Food in the Last Year: Never true    Ran Out of Food in the Last Year: Patient declined  Transportation Needs: Unknown (04/24/2024)   PRAPARE -  Administrator, Civil Service (Medical): No    Lack of Transportation (Non-Medical): Not on file  Physical Activity: Sufficiently Active (04/24/2024)   Exercise Vital Sign    Days of Exercise per Week: 5 days    Minutes of Exercise per Session: 60 min  Stress: Stress Concern Present (04/24/2024)   [employer] of Occupational Health - Occupational Stress Questionnaire    Feeling of Stress : To some extent  Social Connections: Unknown (04/24/2024)   Social Connection and Isolation Panel [NHANES]    Frequency of Communication with Friends and Family: More than three times a week    Frequency of Social Gatherings with Friends and Family: Patient declined    Attends Religious Services: Patient declined    Database administrator or Organizations: No    Attends Engineer, structural: Not on file    Marital Status: Married  Catering manager Violence: Not on file    Family History  Problem Relation Age of Onset   Hypertension Mother    Hyperlipidemia Mother    Cancer Father    Hyperlipidemia Maternal Grandmother      Review of Systems  Constitutional: Negative.  Negative for chills and fever.  HENT: Negative.  Negative for congestion and sore throat.   Respiratory: Negative.  Negative for cough and shortness of breath.   Cardiovascular: Negative.  Negative for chest pain and palpitations.  Gastrointestinal:  Positive for nausea. Negative for abdominal pain, constipation, diarrhea and vomiting.  Genitourinary: Negative.  Negative for dysuria and hematuria.  Skin: Negative.  Negative for rash.  Neurological: Negative.  Negative for dizziness and headaches.  All other systems reviewed and are negative.   Vitals:   04/25/24 1423  BP: (!) 152/100  Pulse: 85  Temp: 98.7 F (37.1 C)  SpO2: 98%    Physical Exam Vitals reviewed.  Constitutional:      Appearance: Normal appearance.  HENT:     Head: Normocephalic.  Eyes:     Extraocular Movements:  Extraocular movements intact.  Cardiovascular:     Rate and Rhythm: Normal rate and regular rhythm.     Pulses: Normal pulses.     Heart sounds: Normal heart sounds.  Pulmonary:     Effort: Pulmonary effort is normal.     Breath sounds: Normal breath sounds.  Musculoskeletal:     Cervical back: No tenderness.  Lymphadenopathy:     Cervical: No cervical adenopathy.  Skin:    General: Skin is warm and dry.     Capillary Refill: Capillary refill takes less than 2 seconds.  Neurological:     General: No focal deficit present.     Mental Status: He is alert and oriented to person, place, and time.  Psychiatric:        Mood and Affect: Mood normal.        Behavior: Behavior normal.    Results for orders placed or performed in visit on 04/25/24 (from the past 24 hours)  POCT HgB A1C     Status: Abnormal   Collection Time: 04/25/24  2:39 PM  Result Value Ref Range   Hemoglobin A1C 6.5 (A) 4.0 - 5.6 %   HbA1c POC (<> result, manual entry)     HbA1c, POC (prediabetic range)     HbA1c, POC (controlled diabetic range)        ASSESSMENT & PLAN: A total of 42 minutes was spent with the patient and counseling/coordination of care regarding preparing for this visit, review of most recent office visit notes, review of multiple chronic medical conditions and their management, cardiovascular risks associated with hypertension diabetes, review of all medications, review of most recent bloodwork results including interpretation of today's hemoglobin A1c, review of health maintenance items, education on nutrition, prognosis, documentation, and need for follow up.   Problem List Items Addressed This Visit       Cardiovascular and Mediastinum   Hypertension associated with type 2 diabetes mellitus (HCC) - Primary   BP Readings from Last 3 Encounters:  04/25/24 (!) 152/100  01/18/24 (!) 152/98  04/20/23 130/80  Elevated blood pressure readings in the office Advised to monitor blood pressure  readings at home daily for the next several weeks and keep a log.  Advised to contact the office if numbers persistently abnormal. Cardiovascular risks associated with uncontrolled hypertension discussed For now continue amlodipine  10 mg daily and valsartan  HCT 80-12.5 mg.  If numbers persistently abnormal recommend to increase valsartan  HCT to 160-12.5 mg daily Well-controlled diabetes with hemoglobin A1c much improved at 6.5 Recommend to continue weekly Mounjaro 5 mg and use Zofran  ODT as needed for nausea.  Continue metformin  500 mg twice a day and Jardiance  10 mg daily Recommend to stop glipizide  Diet and nutrition discussed Will follow-up in 3 months       Relevant Medications   rosuvastatin  (CRESTOR ) 20 MG tablet   ondansetron  (ZOFRAN -ODT) 4 MG disintegrating tablet   Other Relevant Orders   POCT HgB A1C (Completed)     Respiratory   Obstructive sleep apnea treated with continuous positive airway pressure (CPAP)   Stable.  On CPAP treatment.  No concerns.        Other   Current smoker   Cardiovascular and cancer risks associated with smoking discussed. Smoking cessation advice given      Other Visit Diagnoses       Screening for colon cancer       Relevant Orders   Cologuard        Patient Instructions  Hypertension, Adult High blood pressure (hypertension) is when the force of blood pumping through the arteries is too strong. The arteries are the blood vessels that carry blood from the heart throughout the body. Hypertension forces the heart to work harder to pump blood and may cause arteries to become narrow or stiff. Untreated or uncontrolled hypertension can lead to a heart attack, heart failure, a stroke, kidney disease, and other problems. A blood pressure reading consists of a higher number over a lower number. Ideally, your blood pressure should be below 120/80. The first ("top") number is called the systolic pressure. It is a measure of the pressure in your  arteries as your heart beats. The second ("bottom") number is called the diastolic  pressure. It is a measure of the pressure in your arteries as the heart relaxes. What are the causes? The exact cause of this condition is not known. There are some conditions that result in high blood pressure. What increases the risk? Certain factors may make you more likely to develop high blood pressure. Some of these risk factors are under your control, including: Smoking. Not getting enough exercise or physical activity. Being overweight. Having too much fat, sugar, calories, or salt (sodium) in your diet. Drinking too much alcohol. Other risk factors include: Having a personal history of heart disease, diabetes, high cholesterol, or kidney disease. Stress. Having a family history of high blood pressure and high cholesterol. Having obstructive sleep apnea. Age. The risk increases with age. What are the signs or symptoms? High blood pressure may not cause symptoms. Very high blood pressure (hypertensive crisis) may cause: Headache. Fast or irregular heartbeats (palpitations). Shortness of breath. Nosebleed. Nausea and vomiting. Vision changes. Severe chest pain, dizziness, and seizures. How is this diagnosed? This condition is diagnosed by measuring your blood pressure while you are seated, with your arm resting on a flat surface, your legs uncrossed, and your feet flat on the floor. The cuff of the blood pressure monitor will be placed directly against the skin of your upper arm at the level of your heart. Blood pressure should be measured at least twice using the same arm. Certain conditions can cause a difference in blood pressure between your right and left arms. If you have a high blood pressure reading during one visit or you have normal blood pressure with other risk factors, you may be asked to: Return on a different day to have your blood pressure checked again. Monitor your blood pressure at  home for 1 week or longer. If you are diagnosed with hypertension, you may have other blood or imaging tests to help your health care provider understand your overall risk for other conditions. How is this treated? This condition is treated by making healthy lifestyle changes, such as eating healthy foods, exercising more, and reducing your alcohol intake. You may be referred for counseling on a healthy diet and physical activity. Your health care provider may prescribe medicine if lifestyle changes are not enough to get your blood pressure under control and if: Your systolic blood pressure is above 130. Your diastolic blood pressure is above 80. Your personal target blood pressure may vary depending on your medical conditions, your age, and other factors. Follow these instructions at home: Eating and drinking  Eat a diet that is high in fiber and potassium, and low in sodium, added sugar, and fat. An example of this eating plan is called the DASH diet. DASH stands for Dietary Approaches to Stop Hypertension. To eat this way: Eat plenty of fresh fruits and vegetables. Try to fill one half of your plate at each meal with fruits and vegetables. Eat whole grains, such as whole-wheat pasta, brown rice, or whole-grain bread. Fill about one fourth of your plate with whole grains. Eat or drink low-fat dairy products, such as skim milk or low-fat yogurt. Avoid fatty cuts of meat, processed or cured meats, and poultry with skin. Fill about one fourth of your plate with lean proteins, such as fish, chicken without skin, beans, eggs, or tofu. Avoid pre-made and processed foods. These tend to be higher in sodium, added sugar, and fat. Reduce your daily sodium intake. Many people with hypertension should eat less than 1,500 mg of sodium a day.  Do not drink alcohol if: Your health care provider tells you not to drink. You are pregnant, may be pregnant, or are planning to become pregnant. If you drink  alcohol: Limit how much you have to: 0-1 drink a day for women. 0-2 drinks a day for men. Know how much alcohol is in your drink. In the [place] one drink equals one 12 oz bottle of beer (355 mL), one 5 oz glass of wine (148 mL), or one 1 oz glass of hard liquor (44 mL). Lifestyle  Work with your health care provider to maintain a healthy body weight or to lose weight. Ask what an ideal weight is for you. Get at least 30 minutes of exercise that causes your heart to beat faster (aerobic exercise) most days of the week. Activities may include walking, swimming, or biking. Include exercise to strengthen your muscles (resistance exercise), such as Pilates or lifting weights, as part of your weekly exercise routine. Try to do these types of exercises for 30 minutes at least 3 days a week. Do not use any products that contain nicotine or tobacco. These products include cigarettes, chewing tobacco, and vaping devices, such as e-cigarettes. If you need help quitting, ask your health care provider. Monitor your blood pressure at home as told by your health care provider. Keep all follow-up visits. This is important. Medicines Take over-the-counter and prescription medicines only as told by your health care provider. Follow directions carefully. Blood pressure medicines must be taken as prescribed. Do not skip doses of blood pressure medicine. Doing this puts you at risk for problems and can make the medicine less effective. Ask your health care provider about side effects or reactions to medicines that you should watch for. Contact a health care provider if you: Think you are having a reaction to a medicine you are taking. Have headaches that keep coming back (recurring). Feel dizzy. Have swelling in your ankles. Have trouble with your vision. Get help right away if you: Develop a severe headache or confusion. Have unusual weakness or numbness. Feel faint. Have severe pain in your chest or  abdomen. Vomit repeatedly. Have trouble breathing. These symptoms may be an emergency. Get help right away. Call 911. Do not wait to see if the symptoms will go away. Do not drive yourself to the hospital. Summary Hypertension is when the force of blood pumping through your arteries is too strong. If this condition is not controlled, it may put you at risk for serious complications. Your personal target blood pressure may vary depending on your medical conditions, your age, and other factors. For most people, a normal blood pressure is less than 120/80. Hypertension is treated with lifestyle changes, medicines, or a combination of both. Lifestyle changes include losing weight, eating a healthy, low-sodium diet, exercising more, and limiting alcohol. This information is not intended to replace advice given to you by your health care provider. Make sure you discuss any questions you have with your health care provider. Document Revised: 09/23/2021 Document Reviewed: 09/23/2021 Elsevier Patient Education  2024 Elsevier Inc.    Maryagnes Small, MD [facility]

## 2024-04-25 NOTE — Assessment & Plan Note (Signed)
 BP Readings from Last 3 Encounters:  04/25/24 (!) 152/100  01/18/24 (!) 152/98  04/20/23 130/80  Elevated blood pressure readings in the office Advised to monitor blood pressure readings at home daily for the next several weeks and keep a log.  Advised to contact the office if numbers persistently abnormal. Cardiovascular risks associated with uncontrolled hypertension discussed For now continue amlodipine  10 mg daily and valsartan  HCT 80-12.5 mg.  If numbers persistently abnormal recommend to increase valsartan  HCT to 160-12.5 mg daily Well-controlled diabetes with hemoglobin A1c much improved at 6.5 Recommend to continue weekly Mounjaro 5 mg and use Zofran  ODT as needed for nausea.  Continue metformin  500 mg twice a day and Jardiance  10 mg daily Recommend to stop glipizide  Diet and nutrition discussed Will follow-up in 3 months

## 2024-06-20 ENCOUNTER — Other Ambulatory Visit: Payer: Self-pay | Admitting: Emergency Medicine

## 2024-06-20 DIAGNOSIS — I152 Hypertension secondary to endocrine disorders: Secondary | ICD-10-CM

## 2024-06-21 ENCOUNTER — Encounter: Payer: Self-pay | Admitting: Emergency Medicine

## 2024-06-22 ENCOUNTER — Other Ambulatory Visit: Payer: Self-pay | Admitting: Radiology

## 2024-06-22 DIAGNOSIS — E119 Type 2 diabetes mellitus without complications: Secondary | ICD-10-CM

## 2024-06-22 MED ORDER — TIRZEPATIDE 7.5 MG/0.5ML ~~LOC~~ SOAJ: 7.5000 mg | SUBCUTANEOUS | 0 refills | Status: DC

## 2024-06-26 ENCOUNTER — Other Ambulatory Visit (HOSPITAL_COMMUNITY): Payer: Self-pay

## 2024-07-26 ENCOUNTER — Ambulatory Visit: Admitting: Emergency Medicine

## 2024-07-26 ENCOUNTER — Encounter: Payer: Self-pay | Admitting: Emergency Medicine

## 2024-07-26 VITALS — BP 136/90 | HR 85 | Temp 98.6°F | Ht 75.0 in | Wt 234.0 lb

## 2024-07-26 DIAGNOSIS — G4733 Obstructive sleep apnea (adult) (pediatric): Secondary | ICD-10-CM

## 2024-07-26 DIAGNOSIS — E119 Type 2 diabetes mellitus without complications: Secondary | ICD-10-CM

## 2024-07-26 DIAGNOSIS — F17200 Nicotine dependence, unspecified, uncomplicated: Secondary | ICD-10-CM | POA: Diagnosis not present

## 2024-07-26 DIAGNOSIS — E1159 Type 2 diabetes mellitus with other circulatory complications: Secondary | ICD-10-CM | POA: Diagnosis not present

## 2024-07-26 DIAGNOSIS — I152 Hypertension secondary to endocrine disorders: Secondary | ICD-10-CM

## 2024-07-26 LAB — POCT GLYCOSYLATED HEMOGLOBIN (HGB A1C): Hemoglobin A1C: 5.8 % — AB (ref 4.0–5.6)

## 2024-07-26 NOTE — Assessment & Plan Note (Signed)
 BP Readings from Last 3 Encounters:  07/26/24 (!) 136/90  04/25/24 (!) 152/100  01/18/24 (!) 152/98  Well-controlled hypertension Continue amlodipine  10 mg daily along with valsartan  HCT 80-12.5 mg Well-controlled diabetes with hemoglobin A1c of 5.8 Continue Mounjaro  7.5 mg weekly, metformin  500 mg twice a day and Jardiance  10 mg daily Cardiovascular risks associated with hypertension and diabetes discussed Diet and nutrition discussed Follow-up in 3 months

## 2024-07-26 NOTE — Patient Instructions (Signed)
 Health Maintenance, Male  Adopting a healthy lifestyle and getting preventive care are important in promoting health and wellness. Ask your health care provider about:  The right schedule for you to have regular tests and exams.  Things you can do on your own to prevent diseases and keep yourself healthy.  What should I know about diet, weight, and exercise?  Eat a healthy diet    Eat a diet that includes plenty of vegetables, fruits, low-fat dairy products, and lean protein.  Do not eat a lot of foods that are high in solid fats, added sugars, or sodium.  Maintain a healthy weight  Body mass index (BMI) is a measurement that can be used to identify possible weight problems. It estimates body fat based on height and weight. Your health care provider can help determine your BMI and help you achieve or maintain a healthy weight.  Get regular exercise  Get regular exercise. This is one of the most important things you can do for your health. Most adults should:  Exercise for at least 150 minutes each week. The exercise should increase your heart rate and make you sweat (moderate-intensity exercise).  Do strengthening exercises at least twice a week. This is in addition to the moderate-intensity exercise.  Spend less time sitting. Even light physical activity can be beneficial.  Watch cholesterol and blood lipids  Have your blood tested for lipids and cholesterol at 49 years of age, then have this test every 5 years.  You may need to have your cholesterol levels checked more often if:  Your lipid or cholesterol levels are high.  You are older than 49 years of age.  You are at high risk for heart disease.  What should I know about cancer screening?  Many types of cancers can be detected early and may often be prevented. Depending on your health history and family history, you may need to have cancer screening at various ages. This may include screening for:  Colorectal cancer.  Prostate cancer.  Skin cancer.  Lung  cancer.  What should I know about heart disease, diabetes, and high blood pressure?  Blood pressure and heart disease  High blood pressure causes heart disease and increases the risk of stroke. This is more likely to develop in people who have high blood pressure readings or are overweight.  Talk with your health care provider about your target blood pressure readings.  Have your blood pressure checked:  Every 3-5 years if you are 24-52 years of age.  Every year if you are 3 years old or older.  If you are between the ages of 60 and 72 and are a current or former smoker, ask your health care provider if you should have a one-time screening for abdominal aortic aneurysm (AAA).  Diabetes  Have regular diabetes screenings. This checks your fasting blood sugar level. Have the screening done:  Once every three years after age 66 if you are at a normal weight and have a low risk for diabetes.  More often and at a younger age if you are overweight or have a high risk for diabetes.  What should I know about preventing infection?  Hepatitis B  If you have a higher risk for hepatitis B, you should be screened for this virus. Talk with your health care provider to find out if you are at risk for hepatitis B infection.  Hepatitis C  Blood testing is recommended for:  Everyone born from [birth year] through 1966.  Anyone  with known risk factors for hepatitis C.  Sexually transmitted infections (STIs)  You should be screened each year for STIs, including gonorrhea and chlamydia, if:  You are sexually active and are younger than 49 years of age.  You are older than 49 years of age and your health care provider tells you that you are at risk for this type of infection.  Your sexual activity has changed since you were last screened, and you are at increased risk for chlamydia or gonorrhea. Ask your health care provider if you are at risk.  Ask your health care provider about whether you are at high risk for HIV. Your health care provider  may recommend a prescription medicine to help prevent HIV infection. If you choose to take medicine to prevent HIV, you should first get tested for HIV. You should then be tested every 3 months for as long as you are taking the medicine.  Follow these instructions at home:  Alcohol use  Do not drink alcohol if your health care provider tells you not to drink.  If you drink alcohol:  Limit how much you have to 0-2 drinks a day.  Know how much alcohol is in your drink. In the [place] one drink equals one 12 oz bottle of beer (355 mL), one 5 oz glass of wine (148 mL), or one 1 oz glass of hard liquor (44 mL).  Lifestyle  Do not use any products that contain nicotine or tobacco. These products include cigarettes, chewing tobacco, and vaping devices, such as e-cigarettes. If you need help quitting, ask your health care provider.  Do not use street drugs.  Do not share needles.  Ask your health care provider for help if you need support or information about quitting drugs.  General instructions  Schedule regular health, dental, and eye exams.  Stay current with your vaccines.  Tell your health care provider if:  You often feel depressed.  You have ever been abused or do not feel safe at home.  Summary  Adopting a healthy lifestyle and getting preventive care are important in promoting health and wellness.  Follow your health care provider's instructions about healthy diet, exercising, and getting tested or screened for diseases.  Follow your health care provider's instructions on monitoring your cholesterol and blood pressure.  This information is not intended to replace advice given to you by your health care provider. Make sure you discuss any questions you have with your health care provider.  Document Revised: 04/07/2021 Document Reviewed: 04/07/2021  Elsevier Patient Education  2024 [employer].

## 2024-07-26 NOTE — Assessment & Plan Note (Signed)
Stable.  On CPAP treatment.  No concerns. 

## 2024-07-26 NOTE — Progress Notes (Signed)
 Kevin Roy 48 y.o.   Chief Complaint  Patient presents with   Follow-up    Patient here for a 3 month f/u for HTN / DM. No other concerns.     HISTORY OF PRESENT ILLNESS: This is a 48 y.o. male [name] here for 43-month follow-up of hypertension and diabetes Overall doing well.  Eating better and losing weight Exercising regularly Has no complaints or any other medical concerns today. Lab Results  Component Value Date   HGBA1C 6.5 (A) 04/25/2024   Wt Readings from Last 3 Encounters:  07/26/24 234 lb (106.1 kg)  04/25/24 259 lb (117.5 kg)  01/18/24 259 lb (117.5 kg)     HPI   Prior to Admission medications   Medication Sig Start Date End Date Taking? Authorizing Provider  amLODipine  (NORVASC ) 10 MG tablet TAKE 1 TABLET(10 MG) BY MOUTH DAILY 12/13/23  Yes Thurmond Hildebran, Emil Schanz, MD  empagliflozin  (JARDIANCE ) 10 MG TABS tablet Take 1 tablet (10 mg total) by mouth daily before breakfast. 01/18/24  Yes Kengo Sturges, Emil Schanz, MD  glipiZIDE  (GLUCOTROL ) 5 MG tablet TAKE 1 TABLET(5 MG) BY MOUTH TWICE DAILY BEFORE A MEAL 04/05/23  Yes Amarya Kuehl, Emil Schanz, MD  metFORMIN  (GLUCOPHAGE ) 500 MG tablet TAKE 2 TABLETS(1000 MG) BY MOUTH TWICE DAILY WITH A MEAL 11/01/23  Yes Valori Hollenkamp Jose, MD  MOUNJARO  5 MG/0.5ML Pen ADMINISTER 5 MG UNDER THE SKIN 1 TIME A WEEK. 06/20/24  Yes Tirrell Buchberger, Emil Schanz, MD  Multiple Vitamin (MULTI-VITAMIN PO) Take by mouth daily.   Yes [provider]  ondansetron  (ZOFRAN -ODT) 4 MG disintegrating tablet Take 1 tablet (4 mg total) by mouth every 8 (eight) hours as needed for nausea or vomiting. 04/25/24  Yes Joniel Graumann, Emil Schanz, MD  rosuvastatin  (CRESTOR ) 20 MG tablet Take 1 tablet (20 mg total) by mouth daily. 04/25/24 04/20/25 Yes Karolyn Messing, Emil Schanz, MD  tirzepatide  (MOUNJARO ) 7.5 MG/0.5ML Pen Inject 7.5 mg into the skin once a week. 06/22/24  Yes Dillinger Aston, Emil Schanz, MD  valsartan -hydrochlorothiazide  (DIOVAN -HCT) 80-12.5 MG tablet Take 1 tablet by mouth  daily. 01/18/24  Yes Purcell Emil Schanz, MD  aspirin  (SB LOW DOSE ASA EC) 81 MG EC tablet Take 1 tablet (81 mg total) by mouth daily. Swallow whole. Patient not taking: Reported on 07/26/2024 02/16/18   Gretta Ozell CROME, PA-C  sildenafil  (VIAGRA ) 100 MG tablet Take 0.5-1 tablets (50-100 mg total) by mouth daily as needed for erectile dysfunction. Patient not taking: Reported on 07/26/2024 01/08/20   Purcell Emil Schanz, MD    No Known Allergies  Patient Active Problem List   Diagnosis Date Noted   Situational mixed anxiety and depressive disorder 01/18/2024   Current smoker 01/18/2024   Hypertension associated with type 2 diabetes mellitus (HCC) 12/14/2018   Type 2 diabetes mellitus without complication, without long-term current use of insulin (HCC) 11/04/2018   Obstructive sleep apnea treated with continuous positive airway pressure (CPAP) 02/16/2018   OSA (obstructive sleep apnea) 09/29/2017    Past Medical History:  Diagnosis Date   Hypertension    OSA on CPAP     No past surgical history on file.  Social History   Socioeconomic History   Marital status: Single    Spouse name: Not on file   Number of children: Not on file   Years of education: Not on file   Highest education level: Some college, no degree  Occupational History   Not on file  Tobacco Use   Smoking status: Every Day    Current  packs/day: 0.50    Average packs/day: 0.5 packs/day for 25.0 years (12.5 ttl pk-yrs)    Types: Cigarettes   Smokeless tobacco: Never   Tobacco comments:    02/16/18 1/2 PPD    Quit smoking 2 weeks ago  Substance and Sexual Activity   Alcohol use: Yes    Alcohol/week: 1.0 standard drink of alcohol    Types: 1 Cans of beer per week    Comment: OCCASSIONALY   Drug use: No   Sexual activity: Not on file  Other Topics Concern   Not on file  Social History Narrative   Not on file   Social Drivers of Health   Financial Resource Strain: Low Risk  (04/24/2024)   Overall  Financial Resource Strain (CARDIA)    Difficulty of Paying Living Expenses: Not hard at all  Food Insecurity: Unknown (04/24/2024)   Hunger Vital Sign    Worried About Running Out of Food in the Last Year: Never true    Ran Out of Food in the Last Year: Patient declined  Transportation Needs: Unknown (04/24/2024)   PRAPARE - Administrator, Civil Service (Medical): No    Lack of Transportation (Non-Medical): Not on file  Physical Activity: Sufficiently Active (04/24/2024)   Exercise Vital Sign    Days of Exercise per Week: 5 days    Minutes of Exercise per Session: 60 min  Stress: Stress Concern Present (04/24/2024)   [employer] of Occupational Health - Occupational Stress Questionnaire    Feeling of Stress : To some extent  Social Connections: Unknown (04/24/2024)   Social Connection and Isolation Panel    Frequency of Communication with Friends and Family: More than three times a week    Frequency of Social Gatherings with Friends and Family: Patient declined    Attends Religious Services: Patient declined    Database administrator or Organizations: No    Attends Engineer, structural: Not on file    Marital Status: Married  Catering manager Violence: Not on file    Family History  Problem Relation Age of Onset   Hypertension Mother    Hyperlipidemia Mother    Cancer Father    Hyperlipidemia Maternal Grandmother      Review of Systems  Constitutional: Negative.  Negative for chills and fever.  HENT: Negative.  Negative for congestion and sore throat.   Respiratory: Negative.  Negative for cough and shortness of breath.   Cardiovascular: Negative.  Negative for chest pain and palpitations.  Gastrointestinal:  Negative for abdominal pain, diarrhea, nausea and vomiting.  Genitourinary: Negative.  Negative for dysuria and urgency.  Skin: Negative.  Negative for rash.  Neurological: Negative.  Negative for dizziness and headaches.  All other systems  reviewed and are negative.   Vitals:   07/26/24 1434  BP: (!) 136/90  Pulse: 85  Temp: 98.6 F (37 C)  SpO2: 97%    Physical Exam Vitals reviewed.  Constitutional:      Appearance: Normal appearance.  HENT:     Head: Normocephalic.  Eyes:     Extraocular Movements: Extraocular movements intact.     Pupils: Pupils are equal, round, and reactive to light.  Cardiovascular:     Rate and Rhythm: Normal rate.  Pulmonary:     Effort: Pulmonary effort is normal.  Skin:    General: Skin is warm and dry.     Capillary Refill: Capillary refill takes less than 2 seconds.  Neurological:     General:  No focal deficit present.     Mental Status: He is alert and oriented to person, place, and time.  Psychiatric:        Mood and Affect: Mood normal.        Behavior: Behavior normal.    Results for orders placed or performed in visit on 07/26/24 (from the past 24 hours)  POCT HgB A1C     Status: Abnormal   Collection Time: 07/26/24  4:35 PM  Result Value Ref Range   Hemoglobin A1C 5.8 (A) 4.0 - 5.6 %   HbA1c POC (<> result, manual entry)     HbA1c, POC (prediabetic range)     HbA1c, POC (controlled diabetic range)       ASSESSMENT & PLAN: Problem List Items Addressed This Visit       Cardiovascular and Mediastinum   Hypertension associated with type 2 diabetes mellitus (HCC)   BP Readings from Last 3 Encounters:  07/26/24 (!) 136/90  04/25/24 (!) 152/100  01/18/24 (!) 152/98  Well-controlled hypertension Continue amlodipine  10 mg daily along with valsartan  HCT 80-12.5 mg Well-controlled diabetes with hemoglobin A1c of 5.8 Continue Mounjaro  7.5 mg weekly, metformin  500 mg twice a day and Jardiance  10 mg daily Cardiovascular risks associated with hypertension and diabetes discussed Diet and nutrition discussed Follow-up in 3 months         Respiratory   Obstructive sleep apnea treated with continuous positive airway pressure (CPAP)   Stable.  On CPAP treatment.  No  concerns.        Endocrine   Type 2 diabetes mellitus without complication, without long-term current use of insulin (HCC) - Primary   Relevant Orders   POCT HgB A1C     Other   Current smoker   Cardiovascular and cancer risks associated with smoking discussed. Smoking cessation advice given      Patient Instructions  Health Maintenance, Male Adopting a healthy lifestyle and getting preventive care are important in promoting health and wellness. Ask your health care provider about: The right schedule for you to have regular tests and exams. Things you can do on your own to prevent diseases and keep yourself healthy. What should I know about diet, weight, and exercise? Eat a healthy diet  Eat a diet that includes plenty of vegetables, fruits, low-fat dairy products, and lean protein. Do not eat a lot of foods that are high in solid fats, added sugars, or sodium. Maintain a healthy weight Body mass index (BMI) is a measurement that can be used to identify possible weight problems. It estimates body fat based on height and weight. Your health care provider can help determine your BMI and help you achieve or maintain a healthy weight. Get regular exercise Get regular exercise. This is one of the most important things you can do for your health. Most adults should: Exercise for at least 150 minutes each week. The exercise should increase your heart rate and make you sweat (moderate-intensity exercise). Do strengthening exercises at least twice a week. This is in addition to the moderate-intensity exercise. Spend less time sitting. Even light physical activity can be beneficial. Watch cholesterol and blood lipids Have your blood tested for lipids and cholesterol at 49 years of age, then have this test every 5 years. You may need to have your cholesterol levels checked more often if: Your lipid or cholesterol levels are high. You are older than 49 years of age. You are at high risk for  heart disease. What should  I know about cancer screening? Many types of cancers can be detected early and may often be prevented. Depending on your health history and family history, you may need to have cancer screening at various ages. This may include screening for: Colorectal cancer. Prostate cancer. Skin cancer. Lung cancer. What should I know about heart disease, diabetes, and high blood pressure? Blood pressure and heart disease High blood pressure causes heart disease and increases the risk of stroke. This is more likely to develop in people who have high blood pressure readings or are overweight. Talk with your health care provider about your target blood pressure readings. Have your blood pressure checked: Every 3-5 years if you are 71-2 years of age. Every year if you are 36 years old or older. If you are between the ages of 48 and 65 and are a current or former smoker, ask your health care provider if you should have a one-time screening for abdominal aortic aneurysm (AAA). Diabetes Have regular diabetes screenings. This checks your fasting blood sugar level. Have the screening done: Once every three years after age 79 if you are at a normal weight and have a low risk for diabetes. More often and at a younger age if you are overweight or have a high risk for diabetes. What should I know about preventing infection? Hepatitis B If you have a higher risk for hepatitis B, you should be screened for this virus. Talk with your health care provider to find out if you are at risk for hepatitis B infection. Hepatitis C Blood testing is recommended for: Everyone born from [birth year] through 1966. Anyone with known risk factors for hepatitis C. Sexually transmitted infections (STIs) You should be screened each year for STIs, including gonorrhea and chlamydia, if: You are sexually active and are younger than 49 years of age. You are older than 49 years of age and your health care provider  tells you that you are at risk for this type of infection. Your sexual activity has changed since you were last screened, and you are at increased risk for chlamydia or gonorrhea. Ask your health care provider if you are at risk. Ask your health care provider about whether you are at high risk for HIV. Your health care provider may recommend a prescription medicine to help prevent HIV infection. If you choose to take medicine to prevent HIV, you should first get tested for HIV. You should then be tested every 3 months for as long as you are taking the medicine. Follow these instructions at home: Alcohol use Do not drink alcohol if your health care provider tells you not to drink. If you drink alcohol: Limit how much you have to 0-2 drinks a day. Know how much alcohol is in your drink. In the [place] one drink equals one 12 oz bottle of beer (355 mL), one 5 oz glass of wine (148 mL), or one 1 oz glass of hard liquor (44 mL). Lifestyle Do not use any products that contain nicotine or tobacco. These products include cigarettes, chewing tobacco, and vaping devices, such as e-cigarettes. If you need help quitting, ask your health care provider. Do not use street drugs. Do not share needles. Ask your health care provider for help if you need support or information about quitting drugs. General instructions Schedule regular health, dental, and eye exams. Stay current with your vaccines. Tell your health care provider if: You often feel depressed. You have ever been abused or do not feel safe at home.  Summary Adopting a healthy lifestyle and getting preventive care are important in promoting health and wellness. Follow your health care provider's instructions about healthy diet, exercising, and getting tested or screened for diseases. Follow your health care provider's instructions on monitoring your cholesterol and blood pressure. This information is not intended to replace advice given to you by  your health care provider. Make sure you discuss any questions you have with your health care provider. Document Revised: 04/07/2021 Document Reviewed: 04/07/2021 Elsevier Patient Education  2024 Elsevier Inc.     Emil Schaumann, MD [facility]

## 2024-07-26 NOTE — Assessment & Plan Note (Signed)
 Cardiovascular and cancer risks associated with smoking discussed. Smoking cessation advice given.

## 2024-10-01 ENCOUNTER — Telehealth: Admitting: Family

## 2024-10-01 DIAGNOSIS — J069 Acute upper respiratory infection, unspecified: Secondary | ICD-10-CM

## 2024-10-01 MED ORDER — FLUTICASONE PROPIONATE 50 MCG/ACT NA SUSP: 2.0000 | Freq: Every day | NASAL | 6 refills | Status: DC

## 2024-10-01 MED ORDER — BENZONATATE 100 MG PO CAPS: 100.0000 mg | ORAL_CAPSULE | Freq: Three times a day (TID) | ORAL | 0 refills | Status: DC | PRN

## 2024-10-01 NOTE — Progress Notes (Signed)

## 2024-10-20 ENCOUNTER — Encounter: Payer: Self-pay | Admitting: Emergency Medicine

## 2024-10-22 ENCOUNTER — Telehealth: Admitting: Physician Assistant

## 2024-10-22 DIAGNOSIS — M5431 Sciatica, right side: Secondary | ICD-10-CM | POA: Diagnosis not present

## 2024-10-22 MED ORDER — CYCLOBENZAPRINE HCL 10 MG PO TABS: 5.0000 mg | ORAL_TABLET | Freq: Three times a day (TID) | ORAL | 0 refills | Status: AC | PRN

## 2024-10-22 MED ORDER — PREDNISONE 10 MG (21) PO TBPK: ORAL_TABLET | ORAL | 0 refills | Status: AC

## 2024-10-22 MED ORDER — NAPROXEN 500 MG PO TABS: 500.0000 mg | ORAL_TABLET | Freq: Two times a day (BID) | ORAL | 0 refills | Status: AC

## 2024-10-22 NOTE — Progress Notes (Signed)

## 2024-10-22 NOTE — Addendum Note (Signed)
 Addended by: VIVIENNE DELON HERO on: 10/22/2024 12:10 PM   Modules accepted: Orders

## 2024-10-30 ENCOUNTER — Ambulatory Visit: Admitting: Emergency Medicine

## 2024-10-30 ENCOUNTER — Encounter: Payer: Self-pay | Admitting: Emergency Medicine

## 2024-10-30 VITALS — BP 132/86 | HR 86 | Temp 98.1°F | Ht 75.0 in | Wt 243.0 lb

## 2024-10-30 DIAGNOSIS — Z125 Encounter for screening for malignant neoplasm of prostate: Secondary | ICD-10-CM

## 2024-10-30 DIAGNOSIS — E119 Type 2 diabetes mellitus without complications: Secondary | ICD-10-CM

## 2024-10-30 DIAGNOSIS — I152 Hypertension secondary to endocrine disorders: Secondary | ICD-10-CM

## 2024-10-30 DIAGNOSIS — F17200 Nicotine dependence, unspecified, uncomplicated: Secondary | ICD-10-CM

## 2024-10-30 DIAGNOSIS — E1159 Type 2 diabetes mellitus with other circulatory complications: Secondary | ICD-10-CM | POA: Diagnosis not present

## 2024-10-30 DIAGNOSIS — G4733 Obstructive sleep apnea (adult) (pediatric): Secondary | ICD-10-CM

## 2024-10-30 LAB — POCT GLYCOSYLATED HEMOGLOBIN (HGB A1C): HbA1c POC (<> result, manual entry): 5.6 % (ref 4.0–5.6)

## 2024-10-30 LAB — CBC WITH DIFFERENTIAL/PLATELET
Basophils Absolute: 0 K/uL (ref 0.0–0.1)
Basophils Relative: 0.2 % (ref 0.0–3.0)
Eosinophils Absolute: 0.3 K/uL (ref 0.0–0.7)
Eosinophils Relative: 2.3 % (ref 0.0–5.0)
HCT: 43.1 % (ref 39.0–52.0)
Hemoglobin: 14.5 g/dL (ref 13.0–17.0)
Lymphocytes Relative: 31.3 % (ref 12.0–46.0)
Lymphs Abs: 3.6 K/uL (ref 0.7–4.0)
MCHC: 33.6 g/dL (ref 30.0–36.0)
MCV: 87.5 fl (ref 78.0–100.0)
Monocytes Absolute: 0.8 K/uL (ref 0.1–1.0)
Monocytes Relative: 6.9 % (ref 3.0–12.0)
Neutro Abs: 6.7 K/uL (ref 1.4–7.7)
Neutrophils Relative %: 59.3 % (ref 43.0–77.0)
Platelets: 264 K/uL (ref 150.0–400.0)
RBC: 4.92 Mil/uL (ref 4.22–5.81)
RDW: 14.5 % (ref 11.5–15.5)
WBC: 11.4 K/uL — ABNORMAL HIGH (ref 4.0–10.5)

## 2024-10-30 LAB — PSA: PSA: 0.82 ng/mL (ref 0.10–4.00)

## 2024-10-30 NOTE — Assessment & Plan Note (Signed)
Stable.  On CPAP treatment.  No concerns. 

## 2024-10-30 NOTE — Assessment & Plan Note (Signed)
 Cardiovascular and cancer risks associated with smoking discussed. Smoking cessation advice given  Smoking less

## 2024-10-30 NOTE — Patient Instructions (Signed)
 Health Maintenance, Male  Adopting a healthy lifestyle and getting preventive care are important in promoting health and wellness. Ask your health care provider about:  The right schedule for you to have regular tests and exams.  Things you can do on your own to prevent diseases and keep yourself healthy.  What should I know about diet, weight, and exercise?  Eat a healthy diet    Eat a diet that includes plenty of vegetables, fruits, low-fat dairy products, and lean protein.  Do not eat a lot of foods that are high in solid fats, added sugars, or sodium.  Maintain a healthy weight  Body mass index (BMI) is a measurement that can be used to identify possible weight problems. It estimates body fat based on height and weight. Your health care provider can help determine your BMI and help you achieve or maintain a healthy weight.  Get regular exercise  Get regular exercise. This is one of the most important things you can do for your health. Most adults should:  Exercise for at least 150 minutes each week. The exercise should increase your heart rate and make you sweat (moderate-intensity exercise).  Do strengthening exercises at least twice a week. This is in addition to the moderate-intensity exercise.  Spend less time sitting. Even light physical activity can be beneficial.  Watch cholesterol and blood lipids  Have your blood tested for lipids and cholesterol at 49 years of age, then have this test every 5 years.  You may need to have your cholesterol levels checked more often if:  Your lipid or cholesterol levels are high.  You are older than 49 years of age.  You are at high risk for heart disease.  What should I know about cancer screening?  Many types of cancers can be detected early and may often be prevented. Depending on your health history and family history, you may need to have cancer screening at various ages. This may include screening for:  Colorectal cancer.  Prostate cancer.  Skin cancer.  Lung  cancer.  What should I know about heart disease, diabetes, and high blood pressure?  Blood pressure and heart disease  High blood pressure causes heart disease and increases the risk of stroke. This is more likely to develop in people who have high blood pressure readings or are overweight.  Talk with your health care provider about your target blood pressure readings.  Have your blood pressure checked:  Every 3-5 years if you are 24-52 years of age.  Every year if you are 3 years old or older.  If you are between the ages of 60 and 72 and are a current or former smoker, ask your health care provider if you should have a one-time screening for abdominal aortic aneurysm (AAA).  Diabetes  Have regular diabetes screenings. This checks your fasting blood sugar level. Have the screening done:  Once every three years after age 66 if you are at a normal weight and have a low risk for diabetes.  More often and at a younger age if you are overweight or have a high risk for diabetes.  What should I know about preventing infection?  Hepatitis B  If you have a higher risk for hepatitis B, you should be screened for this virus. Talk with your health care provider to find out if you are at risk for hepatitis B infection.  Hepatitis C  Blood testing is recommended for:  Everyone born from [birth year] through 1966.  Anyone  with known risk factors for hepatitis C.  Sexually transmitted infections (STIs)  You should be screened each year for STIs, including gonorrhea and chlamydia, if:  You are sexually active and are younger than 49 years of age.  You are older than 49 years of age and your health care provider tells you that you are at risk for this type of infection.  Your sexual activity has changed since you were last screened, and you are at increased risk for chlamydia or gonorrhea. Ask your health care provider if you are at risk.  Ask your health care provider about whether you are at high risk for HIV. Your health care provider  may recommend a prescription medicine to help prevent HIV infection. If you choose to take medicine to prevent HIV, you should first get tested for HIV. You should then be tested every 3 months for as long as you are taking the medicine.  Follow these instructions at home:  Alcohol use  Do not drink alcohol if your health care provider tells you not to drink.  If you drink alcohol:  Limit how much you have to 0-2 drinks a day.  Know how much alcohol is in your drink. In the [place] one drink equals one 12 oz bottle of beer (355 mL), one 5 oz glass of wine (148 mL), or one 1 oz glass of hard liquor (44 mL).  Lifestyle  Do not use any products that contain nicotine or tobacco. These products include cigarettes, chewing tobacco, and vaping devices, such as e-cigarettes. If you need help quitting, ask your health care provider.  Do not use street drugs.  Do not share needles.  Ask your health care provider for help if you need support or information about quitting drugs.  General instructions  Schedule regular health, dental, and eye exams.  Stay current with your vaccines.  Tell your health care provider if:  You often feel depressed.  You have ever been abused or do not feel safe at home.  Summary  Adopting a healthy lifestyle and getting preventive care are important in promoting health and wellness.  Follow your health care provider's instructions about healthy diet, exercising, and getting tested or screened for diseases.  Follow your health care provider's instructions on monitoring your cholesterol and blood pressure.  This information is not intended to replace advice given to you by your health care provider. Make sure you discuss any questions you have with your health care provider.  Document Revised: 04/07/2021 Document Reviewed: 04/07/2021  Elsevier Patient Education  2024 [employer].

## 2024-10-30 NOTE — Assessment & Plan Note (Signed)
 BP Readings from Last 3 Encounters:  10/30/24 132/86  07/26/24 (!) 136/90  04/25/24 (!) 152/100   Lab Results  Component Value Date   HGBA1C 5.6 10/30/2024  Well-controlled hypertension Continue amlodipine  10 mg daily along with valsartan  HCT 80-12.5 mg Well-controlled diabetes with hemoglobin A1c of 5.8 Continue Mounjaro  7.5 mg weekly, metformin  500 mg twice a day and Jardiance  10 mg daily Cardiovascular risks associated with hypertension and diabetes discussed Diet and nutrition discussed Follow-up in 3 months

## 2024-10-30 NOTE — Progress Notes (Signed)
 Kevin Roy 48 y.o.   Chief Complaint  Patient presents with   Follow-up    3 months     HISTORY OF PRESENT ILLNESS: This is a 48 y.o. male here for 97-month follow-up of chronic medical conditions including hypertension and diabetes Overall doing well.  Has no complaints or medical concerns today. Lab Results  Component Value Date   HGBA1C 5.8 (A) 07/26/2024   Wt Readings from Last 3 Encounters:  10/30/24 243 lb (110.2 kg)  07/26/24 234 lb (106.1 kg)  04/25/24 259 lb (117.5 kg)   BP Readings from Last 3 Encounters:  10/30/24 132/86  07/26/24 (!) 136/90  04/25/24 (!) 152/100     HPI   Prior to Admission medications   Medication Sig Start Date End Date Taking? Authorizing Provider  amLODipine  (NORVASC ) 10 MG tablet TAKE 1 TABLET(10 MG) BY MOUTH DAILY 12/13/23   Purcell Emil Schanz, MD  aspirin  (SB LOW DOSE ASA EC) 81 MG EC tablet Take 1 tablet (81 mg total) by mouth daily. Swallow whole. Patient not taking: Reported on 07/26/2024 02/16/18   Gretta Ozell CROME, PA-C  cyclobenzaprine  (FLEXERIL ) 10 MG tablet Take 0.5-1 tablets (5-10 mg total) by mouth 3 (three) times daily as needed. 10/22/24   Burnette, Jennifer M, PA-C  empagliflozin  (JARDIANCE ) 10 MG TABS tablet Take 1 tablet (10 mg total) by mouth daily before breakfast. 01/18/24   Purcell Emil Schanz, MD  metFORMIN  (GLUCOPHAGE ) 500 MG tablet TAKE 2 TABLETS(1000 MG) BY MOUTH TWICE DAILY WITH A MEAL 11/01/23   Purcell Emil Schanz, MD  Multiple Vitamin (MULTI-VITAMIN PO) Take by mouth daily.    [provider]  naproxen  (NAPROSYN ) 500 MG tablet Take 1 tablet (500 mg total) by mouth 2 (two) times daily with a meal. 10/22/24   Burnette, Delon HERO, PA-C  ondansetron  (ZOFRAN -ODT) 4 MG disintegrating tablet Take 1 tablet (4 mg total) by mouth every 8 (eight) hours as needed for nausea or vomiting. 04/25/24   Milano Rosevear, Emil Schanz, MD  predniSONE  (STERAPRED UNI-PAK 21 TAB) 10 MG (21) TBPK tablet 6 day taper; take as  directed on package instructions 10/22/24   Vivienne Delon HERO, PA-C  rosuvastatin  (CRESTOR ) 20 MG tablet Take 1 tablet (20 mg total) by mouth daily. 04/25/24 04/20/25  Purcell Emil Schanz, MD  sildenafil  (VIAGRA ) 100 MG tablet Take 0.5-1 tablets (50-100 mg total) by mouth daily as needed for erectile dysfunction. Patient not taking: Reported on 07/26/2024 01/08/20   Lex Linhares Jose, MD  tirzepatide  (MOUNJARO ) 7.5 MG/0.5ML Pen Inject 7.5 mg into the skin once a week. 06/22/24   Purcell Emil Schanz, MD  valsartan -hydrochlorothiazide  (DIOVAN -HCT) 80-12.5 MG tablet Take 1 tablet by mouth daily. 01/18/24   Purcell Emil Schanz, MD    No Known Allergies  Patient Active Problem List   Diagnosis Date Noted   Situational mixed anxiety and depressive disorder 01/18/2024   Current smoker 01/18/2024   Hypertension associated with type 2 diabetes mellitus (HCC) 12/14/2018   Type 2 diabetes mellitus without complication, without long-term current use of insulin (HCC) 11/04/2018   Obstructive sleep apnea treated with continuous positive airway pressure (CPAP) 02/16/2018   OSA (obstructive sleep apnea) 09/29/2017    Past Medical History:  Diagnosis Date   Hypertension    OSA on CPAP     No past surgical history on file.  Social History   Socioeconomic History   Marital status: Single    Spouse name: Not on file   Number of children: Not on file  Years of education: Not on file   Highest education level: Some college, no degree  Occupational History   Not on file  Tobacco Use   Smoking status: Every Day    Current packs/day: 0.50    Average packs/day: 0.5 packs/day for 25.0 years (12.5 ttl pk-yrs)    Types: Cigarettes   Smokeless tobacco: Never   Tobacco comments:    02/16/18 1/2 PPD    Quit smoking 2 weeks ago  Substance and Sexual Activity   Alcohol use: Yes    Alcohol/week: 1.0 standard drink of alcohol    Types: 1 Cans of beer per week    Comment: OCCASSIONALY   Drug  use: No   Sexual activity: Not on file  Other Topics Concern   Not on file  Social History Narrative   Not on file   Social Drivers of Health   Financial Resource Strain: Low Risk  (10/29/2024)   Overall Financial Resource Strain (CARDIA)    Difficulty of Paying Living Expenses: Not very hard  Food Insecurity: No Food Insecurity (10/29/2024)   Hunger Vital Sign    Worried About Running Out of Food in the Last Year: Never true    Ran Out of Food in the Last Year: Never true  Transportation Needs: No Transportation Needs (10/29/2024)   PRAPARE - Administrator, Civil Service (Medical): No    Lack of Transportation (Non-Medical): No  Physical Activity: Sufficiently Active (10/29/2024)   Exercise Vital Sign    Days of Exercise per Week: 5 days    Minutes of Exercise per Session: 40 min  Stress: No Stress Concern Present (10/29/2024)   [employer] of Occupational Health - Occupational Stress Questionnaire    Feeling of Stress: Not at all  Social Connections: Socially Isolated (10/29/2024)   Social Connection and Isolation Panel    Frequency of Communication with Friends and Family: Once a week    Frequency of Social Gatherings with Friends and Family: Once a week    Attends Religious Services: Patient declined    Database Administrator or Organizations: No    Attends Engineer, Structural: Not on file    Marital Status: Married  Catering Manager Violence: Not on file    Family History  Problem Relation Age of Onset   Hypertension Mother    Hyperlipidemia Mother    Cancer Father    Hyperlipidemia Maternal Grandmother      Review of Systems  Constitutional: Negative.  Negative for chills and fever.  HENT: Negative.  Negative for congestion and sore throat.   Respiratory: Negative.  Negative for cough and shortness of breath.   Cardiovascular: Negative.  Negative for chest pain and palpitations.  Gastrointestinal:  Negative for abdominal pain,  diarrhea, nausea and vomiting.  Genitourinary: Negative.  Negative for dysuria and hematuria.  Skin: Negative.  Negative for rash.  Neurological: Negative.  Negative for dizziness and headaches.  All other systems reviewed and are negative.   Vitals:   10/30/24 1418  BP: 132/86  Pulse: 86  Temp: 98.1 F (36.7 C)  SpO2: 98%    Physical Exam Vitals reviewed.  Constitutional:      Appearance: Normal appearance.  HENT:     Head: Normocephalic.     Mouth/Throat:     Mouth: Mucous membranes are moist.     Pharynx: Oropharynx is clear.  Eyes:     Extraocular Movements: Extraocular movements intact.     Conjunctiva/sclera: Conjunctivae normal.  Pupils: Pupils are equal, round, and reactive to light.  Cardiovascular:     Rate and Rhythm: Normal rate and regular rhythm.     Pulses: Normal pulses.     Heart sounds: Normal heart sounds.  Pulmonary:     Effort: Pulmonary effort is normal.     Breath sounds: Normal breath sounds.  Musculoskeletal:     Cervical back: No tenderness.  Lymphadenopathy:     Cervical: No cervical adenopathy.  Skin:    General: Skin is warm and dry.     Capillary Refill: Capillary refill takes less than 2 seconds.  Neurological:     General: No focal deficit present.     Mental Status: He is alert and oriented to person, place, and time.  Psychiatric:        Mood and Affect: Mood normal.        Behavior: Behavior normal.    Results for orders placed or performed in visit on 10/30/24 (from the past 24 hours)  POCT HgB A1C     Status: Abnormal   Collection Time: 10/30/24  2:33 PM  Result Value Ref Range   Hemoglobin A1C     HbA1c POC (<> result, manual entry) 5.6 4.0 - 5.6 %   HbA1c, POC (prediabetic range)     HbA1c, POC (controlled diabetic range)       ASSESSMENT & PLAN: Problem List Items Addressed This Visit       Cardiovascular and Mediastinum   Hypertension associated with type 2 diabetes mellitus (HCC) - Primary   BP Readings  from Last 3 Encounters:  10/30/24 132/86  07/26/24 (!) 136/90  04/25/24 (!) 152/100   Lab Results  Component Value Date   HGBA1C 5.6 10/30/2024  Well-controlled hypertension Continue amlodipine  10 mg daily along with valsartan  HCT 80-12.5 mg Well-controlled diabetes with hemoglobin A1c of 5.8 Continue Mounjaro  7.5 mg weekly, metformin  500 mg twice a day and Jardiance  10 mg daily Cardiovascular risks associated with hypertension and diabetes discussed Diet and nutrition discussed Follow-up in 3 months       Relevant Orders   CBC with Differential/Platelet   Comprehensive metabolic panel with GFR   Lipid panel     Respiratory   Obstructive sleep apnea treated with continuous positive airway pressure (CPAP)   Stable.  On CPAP treatment.  No concerns.        Endocrine   Type 2 diabetes mellitus without complication, without long-term current use of insulin (HCC)   Relevant Orders   POCT HgB A1C (Completed)     Other   Current smoker   Cardiovascular and cancer risks associated with smoking discussed. Smoking cessation advice given  Smoking less      Other Visit Diagnoses       Screening for prostate cancer       Relevant Orders   PSA      Patient Instructions  Health Maintenance, Male Adopting a healthy lifestyle and getting preventive care are important in promoting health and wellness. Ask your health care provider about: The right schedule for you to have regular tests and exams. Things you can do on your own to prevent diseases and keep yourself healthy. What should I know about diet, weight, and exercise? Eat a healthy diet  Eat a diet that includes plenty of vegetables, fruits, low-fat dairy products, and lean protein. Do not eat a lot of foods that are high in solid fats, added sugars, or sodium. Maintain a healthy weight Body mass index (  BMI) is a measurement that can be used to identify possible weight problems. It estimates body fat based on height  and weight. Your health care provider can help determine your BMI and help you achieve or maintain a healthy weight. Get regular exercise Get regular exercise. This is one of the most important things you can do for your health. Most adults should: Exercise for at least 150 minutes each week. The exercise should increase your heart rate and make you sweat (moderate-intensity exercise). Do strengthening exercises at least twice a week. This is in addition to the moderate-intensity exercise. Spend less time sitting. Even light physical activity can be beneficial. Watch cholesterol and blood lipids Have your blood tested for lipids and cholesterol at 49 years of age, then have this test every 5 years. You may need to have your cholesterol levels checked more often if: Your lipid or cholesterol levels are high. You are older than 49 years of age. You are at high risk for heart disease. What should I know about cancer screening? Many types of cancers can be detected early and may often be prevented. Depending on your health history and family history, you may need to have cancer screening at various ages. This may include screening for: Colorectal cancer. Prostate cancer. Skin cancer. Lung cancer. What should I know about heart disease, diabetes, and high blood pressure? Blood pressure and heart disease High blood pressure causes heart disease and increases the risk of stroke. This is more likely to develop in people who have high blood pressure readings or are overweight. Talk with your health care provider about your target blood pressure readings. Have your blood pressure checked: Every 3-5 years if you are 84-4 years of age. Every year if you are 48 years old or older. If you are between the ages of 16 and 47 and are a current or former smoker, ask your health care provider if you should have a one-time screening for abdominal aortic aneurysm (AAA). Diabetes Have regular diabetes  screenings. This checks your fasting blood sugar level. Have the screening done: Once every three years after age 61 if you are at a normal weight and have a low risk for diabetes. More often and at a younger age if you are overweight or have a high risk for diabetes. What should I know about preventing infection? Hepatitis B If you have a higher risk for hepatitis B, you should be screened for this virus. Talk with your health care provider to find out if you are at risk for hepatitis B infection. Hepatitis C Blood testing is recommended for: Everyone born from [birth year] through 1966. Anyone with known risk factors for hepatitis C. Sexually transmitted infections (STIs) You should be screened each year for STIs, including gonorrhea and chlamydia, if: You are sexually active and are younger than 49 years of age. You are older than 49 years of age and your health care provider tells you that you are at risk for this type of infection. Your sexual activity has changed since you were last screened, and you are at increased risk for chlamydia or gonorrhea. Ask your health care provider if you are at risk. Ask your health care provider about whether you are at high risk for HIV. Your health care provider may recommend a prescription medicine to help prevent HIV infection. If you choose to take medicine to prevent HIV, you should first get tested for HIV. You should then be tested every 3 months for as long as  you are taking the medicine. Follow these instructions at home: Alcohol use Do not drink alcohol if your health care provider tells you not to drink. If you drink alcohol: Limit how much you have to 0-2 drinks a day. Know how much alcohol is in your drink. In the [place] one drink equals one 12 oz bottle of beer (355 mL), one 5 oz glass of wine (148 mL), or one 1 oz glass of hard liquor (44 mL). Lifestyle Do not use any products that contain nicotine or tobacco. These products include cigarettes,  chewing tobacco, and vaping devices, such as e-cigarettes. If you need help quitting, ask your health care provider. Do not use street drugs. Do not share needles. Ask your health care provider for help if you need support or information about quitting drugs. General instructions Schedule regular health, dental, and eye exams. Stay current with your vaccines. Tell your health care provider if: You often feel depressed. You have ever been abused or do not feel safe at home. Summary Adopting a healthy lifestyle and getting preventive care are important in promoting health and wellness. Follow your health care provider's instructions about healthy diet, exercising, and getting tested or screened for diseases. Follow your health care provider's instructions on monitoring your cholesterol and blood pressure. This information is not intended to replace advice given to you by your health care provider. Make sure you discuss any questions you have with your health care provider. Document Revised: 04/07/2021 Document Reviewed: 04/07/2021 Elsevier Patient Education  2024 Elsevier Inc.     Emil Schaumann, MD [facility]

## 2024-10-31 ENCOUNTER — Ambulatory Visit: Payer: Self-pay | Admitting: Emergency Medicine

## 2024-10-31 LAB — LIPID PANEL
Cholesterol: 151 mg/dL (ref 0–200)
HDL: 43.7 mg/dL (ref >39.00)
LDL Cholesterol: 79 mg/dL (ref 0–99)
NonHDL: 107.52
Total CHOL/HDL Ratio: 3
Triglycerides: 144 mg/dL (ref 0.0–149.0)
VLDL: 28.8 mg/dL (ref 0.0–40.0)

## 2024-10-31 LAB — COMPREHENSIVE METABOLIC PANEL WITH GFR
ALT: 62 U/L — ABNORMAL HIGH (ref 0–53)
AST: 27 U/L (ref 0–37)
Albumin: 4.3 g/dL (ref 3.5–5.2)
Alkaline Phosphatase: 55 U/L (ref 39–117)
BUN: 16 mg/dL (ref 6–23)
CO2: 29 meq/L (ref 19–32)
Calcium: 9.6 mg/dL (ref 8.4–10.5)
Chloride: 101 meq/L (ref 96–112)
Creatinine, Ser: 0.73 mg/dL (ref 0.40–1.50)
GFR: 107.26 mL/min (ref >60.00)
Glucose, Bld: 89 mg/dL (ref 70–99)
Potassium: 3.5 meq/L (ref 3.5–5.1)
Sodium: 140 meq/L (ref 135–145)
Total Bilirubin: 0.4 mg/dL (ref 0.2–1.2)
Total Protein: 6.9 g/dL (ref 6.0–8.3)

## 2024-11-07 ENCOUNTER — Encounter: Payer: Self-pay | Admitting: Emergency Medicine

## 2024-11-07 MED ORDER — TIRZEPATIDE 12.5 MG/0.5ML ~~LOC~~ SOAJ: 12.5000 mg | SUBCUTANEOUS | 0 refills | Status: AC

## 2024-11-07 NOTE — Addendum Note (Signed)
 Addended by: Maximiano Lott S on: 11/07/2024 11:39 AM   Modules accepted: Orders

## 2024-11-10 MED ORDER — TIRZEPATIDE 10 MG/0.5ML ~~LOC~~ SOAJ: 10.0000 mg | SUBCUTANEOUS | 0 refills | Status: AC

## 2024-11-13 ENCOUNTER — Other Ambulatory Visit: Payer: Self-pay

## 2024-11-13 DIAGNOSIS — E119 Type 2 diabetes mellitus without complications: Secondary | ICD-10-CM

## 2024-11-13 MED ORDER — TIRZEPATIDE 7.5 MG/0.5ML ~~LOC~~ SOAJ: 7.5000 mg | SUBCUTANEOUS | 0 refills | Status: AC

## 2024-11-13 NOTE — Telephone Encounter (Signed)
 Patient should be increasing to 10mg  after 7.5 or is ok for him to stay on 7.5mg ?

## 2024-11-13 NOTE — Telephone Encounter (Signed)
 Recommend to stay on 7.5 mg

## 2024-11-14 ENCOUNTER — Telehealth: Payer: Self-pay

## 2024-11-14 ENCOUNTER — Other Ambulatory Visit (HOSPITAL_COMMUNITY): Payer: Self-pay

## 2024-11-14 NOTE — Telephone Encounter (Signed)
 Pharmacy Patient Advocate Encounter  Received notification from [employer] that Prior Authorization for Mounjaro  7.5 has been APPROVED from 11/14/24 to 11/14/25. Ran test claim, Copay is $25.00. This test claim was processed through [facility]- copay amounts may vary at other pharmacies due to pharmacy/plan contracts, or as the patient moves through the different stages of their insurance plan.   PA #/Case ID/Reference #: # [id number]

## 2024-11-14 NOTE — Telephone Encounter (Signed)
 Pharmacy Patient Advocate Encounter   Received notification from [employer]base that prior authorization for Mounjaro  7.5 is required/requested.   Insurance verification completed.   The patient is insured through [facility].   Per test claim: PA required; PA submitted to above mentioned insurance via Latent Key/confirmation #/EOC [id number] Status is pending

## 2024-12-27 ENCOUNTER — Encounter: Payer: Self-pay | Admitting: *Deleted

## 2024-12-27 NOTE — Progress Notes (Signed)
 Kevin Roy                                          MRN: [id number]   12/27/2024   The [facility] Quality Team Specialist reviewed this patient medical record for the purposes of chart review for care gap closure. The following were reviewed: abstraction for care gap closure-kidney health evaluation for diabetes:eGFR  and uACR.    [facility]BCI Quality Team

## 2024-12-28 ENCOUNTER — Other Ambulatory Visit: Payer: Self-pay | Admitting: Emergency Medicine

## 2024-12-28 ENCOUNTER — Encounter: Payer: Self-pay | Admitting: Emergency Medicine

## 2024-12-28 DIAGNOSIS — I152 Hypertension secondary to endocrine disorders: Secondary | ICD-10-CM

## 2024-12-29 ENCOUNTER — Other Ambulatory Visit: Payer: Self-pay

## 2025-01-29 ENCOUNTER — Ambulatory Visit: Admitting: Emergency Medicine
